# Patient Record
Sex: Female | Born: 1955 | Race: White | Hispanic: No | Marital: Married | Smoking: Never smoker
Health system: Southern US, Community
[De-identification: ages and names within clinical notes are randomized; demographics above are authoritative.]

## PROBLEM LIST (undated history)

## (undated) DIAGNOSIS — E785 Hyperlipidemia, unspecified: Secondary | ICD-10-CM

## (undated) DIAGNOSIS — M1990 Unspecified osteoarthritis, unspecified site: Secondary | ICD-10-CM

## (undated) DIAGNOSIS — T7840XA Allergy, unspecified, initial encounter: Secondary | ICD-10-CM

## (undated) DIAGNOSIS — Z923 Personal history of irradiation: Secondary | ICD-10-CM

## (undated) DIAGNOSIS — Z8571 Personal history of Hodgkin lymphoma: Secondary | ICD-10-CM

## (undated) DIAGNOSIS — Z853 Personal history of malignant neoplasm of breast: Secondary | ICD-10-CM

## (undated) DIAGNOSIS — K219 Gastro-esophageal reflux disease without esophagitis: Secondary | ICD-10-CM

## (undated) DIAGNOSIS — I10 Essential (primary) hypertension: Secondary | ICD-10-CM

## (undated) DIAGNOSIS — C801 Malignant (primary) neoplasm, unspecified: Secondary | ICD-10-CM

## (undated) DIAGNOSIS — C50919 Malignant neoplasm of unspecified site of unspecified female breast: Secondary | ICD-10-CM

## (undated) HISTORY — DX: Personal history of Hodgkin lymphoma: Z85.71

## (undated) HISTORY — DX: Personal history of malignant neoplasm of breast: Z85.3

## (undated) HISTORY — DX: Gastro-esophageal reflux disease without esophagitis: K21.9

## (undated) HISTORY — DX: Unspecified osteoarthritis, unspecified site: M19.90

## (undated) HISTORY — DX: Allergy, unspecified, initial encounter: T78.40XA

## (undated) HISTORY — DX: Hyperlipidemia, unspecified: E78.5

## (undated) HISTORY — DX: Malignant (primary) neoplasm, unspecified: C80.1

## (undated) HISTORY — PX: OTHER SURGICAL HISTORY: SHX169

## (undated) HISTORY — DX: Essential (primary) hypertension: I10

---
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
## 1977-01-05 HISTORY — PX: LYMPH NODE BIOPSY: SHX201

## 1998-01-05 DIAGNOSIS — I10 Essential (primary) hypertension: Secondary | ICD-10-CM

## 1998-01-05 HISTORY — DX: Essential (primary) hypertension: I10

## 1998-01-05 HISTORY — PX: ABDOMINAL HYSTERECTOMY: SHX81

## 2004-04-14 ENCOUNTER — Ambulatory Visit: Payer: Self-pay | Admitting: Unknown Physician Specialty

## 2004-07-18 ENCOUNTER — Ambulatory Visit: Payer: Self-pay | Admitting: Unknown Physician Specialty

## 2004-10-16 ENCOUNTER — Ambulatory Visit: Payer: Self-pay | Admitting: Unknown Physician Specialty

## 2004-10-27 ENCOUNTER — Ambulatory Visit: Payer: Self-pay | Admitting: Unknown Physician Specialty

## 2005-01-05 DIAGNOSIS — C50919 Malignant neoplasm of unspecified site of unspecified female breast: Secondary | ICD-10-CM

## 2005-01-05 HISTORY — PX: BREAST LUMPECTOMY: SHX2

## 2005-01-05 HISTORY — DX: Malignant neoplasm of unspecified site of unspecified female breast: C50.919

## 2005-01-05 HISTORY — PX: BREAST BIOPSY: SHX20

## 2005-02-27 ENCOUNTER — Emergency Department: Payer: Self-pay | Admitting: Emergency Medicine

## 2005-02-27 ENCOUNTER — Other Ambulatory Visit: Payer: Self-pay

## 2005-11-19 ENCOUNTER — Ambulatory Visit: Payer: Self-pay | Admitting: Unknown Physician Specialty

## 2005-11-25 ENCOUNTER — Ambulatory Visit: Payer: Self-pay | Admitting: Unknown Physician Specialty

## 2005-12-09 ENCOUNTER — Ambulatory Visit: Payer: Self-pay | Admitting: Oncology

## 2005-12-10 ENCOUNTER — Other Ambulatory Visit: Payer: Self-pay

## 2005-12-17 ENCOUNTER — Ambulatory Visit: Payer: Self-pay | Admitting: General Surgery

## 2006-01-05 DIAGNOSIS — C801 Malignant (primary) neoplasm, unspecified: Secondary | ICD-10-CM

## 2006-01-05 DIAGNOSIS — Z923 Personal history of irradiation: Secondary | ICD-10-CM

## 2006-01-05 HISTORY — PX: OTHER SURGICAL HISTORY: SHX169

## 2006-01-05 HISTORY — DX: Malignant (primary) neoplasm, unspecified: C80.1

## 2006-01-05 HISTORY — DX: Personal history of irradiation: Z92.3

## 2006-01-13 ENCOUNTER — Ambulatory Visit: Payer: Self-pay | Admitting: Radiation Oncology

## 2006-02-05 ENCOUNTER — Ambulatory Visit: Payer: Self-pay | Admitting: Radiation Oncology

## 2006-06-28 ENCOUNTER — Ambulatory Visit: Payer: Self-pay | Admitting: General Surgery

## 2006-12-27 ENCOUNTER — Ambulatory Visit: Payer: Self-pay | Admitting: General Surgery

## 2008-01-11 ENCOUNTER — Ambulatory Visit: Payer: Self-pay | Admitting: General Surgery

## 2008-02-10 ENCOUNTER — Ambulatory Visit: Payer: Self-pay | Admitting: General Surgery

## 2008-06-20 ENCOUNTER — Ambulatory Visit: Payer: Self-pay | Admitting: General Surgery

## 2009-01-23 ENCOUNTER — Ambulatory Visit: Payer: Self-pay | Admitting: General Surgery

## 2009-08-07 ENCOUNTER — Ambulatory Visit: Payer: Self-pay | Admitting: General Surgery

## 2009-11-12 ENCOUNTER — Ambulatory Visit: Payer: Self-pay | Admitting: Unknown Physician Specialty

## 2010-01-05 HISTORY — PX: UPPER GI ENDOSCOPY: SHX6162

## 2010-01-05 HISTORY — PX: COLONOSCOPY: SHX174

## 2010-03-06 ENCOUNTER — Ambulatory Visit: Payer: Self-pay | Admitting: General Surgery

## 2010-11-13 ENCOUNTER — Other Ambulatory Visit: Payer: Self-pay | Admitting: Internal Medicine

## 2010-11-14 ENCOUNTER — Ambulatory Visit: Payer: Self-pay | Admitting: Internal Medicine

## 2011-02-23 ENCOUNTER — Observation Stay: Payer: Self-pay | Admitting: Internal Medicine

## 2011-02-23 LAB — URINALYSIS, COMPLETE
Bilirubin,UR: NEGATIVE
Glucose,UR: 50 mg/dL (ref 0–75)
Leukocyte Esterase: NEGATIVE
Nitrite: NEGATIVE
Protein: NEGATIVE
RBC,UR: 2 /HPF (ref 0–5)
Specific Gravity: 1.042 (ref 1.003–1.030)
WBC UR: <1 /HPF (ref 0–5)

## 2011-02-23 LAB — COMPREHENSIVE METABOLIC PANEL
Albumin: 4.4 g/dL (ref 3.4–5.0)
Alkaline Phosphatase: 77 U/L (ref 50–136)
Anion Gap: 8 (ref 7–16)
Calcium, Total: 9.7 mg/dL (ref 8.5–10.1)
Co2: 27 mmol/L (ref 21–32)
EGFR (Non-African Amer.): >60
Glucose: 87 mg/dL (ref 65–99)
Osmolality: 270 (ref 275–301)
Potassium: 3.8 mmol/L (ref 3.5–5.1)
SGOT(AST): 26 U/L (ref 15–37)
SGPT (ALT): 31 U/L

## 2011-02-23 LAB — CBC
HGB: 15.6 g/dL (ref 12.0–16.0)
MCH: 31.6 pg (ref 26.0–34.0)
Platelet: 200 10*3/uL (ref 150–440)
RBC: 4.95 10*6/uL (ref 3.80–5.20)
RDW: 14.5 % (ref 11.5–14.5)

## 2011-02-23 LAB — CK TOTAL AND CKMB (NOT AT ARMC)
CK, Total: 81 U/L (ref 21–215)
CK-MB: 1.2 ng/mL (ref 0.5–3.6)

## 2011-02-23 LAB — TROPONIN I: Troponin-I: <0.02 ng/mL

## 2011-02-24 DIAGNOSIS — R079 Chest pain, unspecified: Secondary | ICD-10-CM

## 2011-02-24 LAB — BASIC METABOLIC PANEL
Anion Gap: 7 (ref 7–16)
BUN: 11 mg/dL (ref 7–18)
Calcium, Total: 9.4 mg/dL (ref 8.5–10.1)
Chloride: 102 mmol/L (ref 98–107)
Creatinine: 0.66 mg/dL (ref 0.60–1.30)
EGFR (Non-African Amer.): >60
Glucose: 92 mg/dL (ref 65–99)
Osmolality: 275 (ref 275–301)
Potassium: 3.9 mmol/L (ref 3.5–5.1)

## 2011-02-24 LAB — CBC WITH DIFFERENTIAL/PLATELET
Basophil #: 0.1 10*3/uL (ref 0.0–0.1)
Eosinophil #: 0.3 10*3/uL (ref 0.0–0.7)
HGB: 13.1 g/dL (ref 12.0–16.0)
Lymphocyte %: 31.2 %
MCH: 31.9 pg (ref 26.0–34.0)
MCHC: 33.3 g/dL (ref 32.0–36.0)
Neutrophil %: 54.5 %
Platelet: 235 10*3/uL (ref 150–440)
RDW: 13 % (ref 11.5–14.5)

## 2011-02-24 LAB — TSH: Thyroid Stimulating Horm: 4.28 u[IU]/mL

## 2011-02-24 LAB — LIPID PANEL
Cholesterol: 234 mg/dL — ABNORMAL HIGH (ref 0–200)
HDL Cholesterol: 84 mg/dL — ABNORMAL HIGH (ref 40–60)
Ldl Cholesterol, Calc: 129 mg/dL — ABNORMAL HIGH (ref 0–100)
Triglycerides: 105 mg/dL (ref 0–200)

## 2011-03-09 ENCOUNTER — Ambulatory Visit: Payer: Self-pay | Admitting: General Surgery

## 2011-03-17 ENCOUNTER — Encounter: Payer: Self-pay | Admitting: *Deleted

## 2011-03-18 ENCOUNTER — Ambulatory Visit (INDEPENDENT_AMBULATORY_CARE_PROVIDER_SITE_OTHER): Payer: PRIVATE HEALTH INSURANCE | Admitting: Cardiovascular Disease

## 2011-03-18 ENCOUNTER — Encounter: Payer: Self-pay | Admitting: Cardiovascular Disease

## 2011-03-18 VITALS — BP 153/93 | HR 88 | Ht 63.0 in | Wt 158.0 lb

## 2011-03-18 DIAGNOSIS — E785 Hyperlipidemia, unspecified: Secondary | ICD-10-CM

## 2011-03-18 DIAGNOSIS — E782 Mixed hyperlipidemia: Secondary | ICD-10-CM | POA: Insufficient documentation

## 2011-03-18 DIAGNOSIS — R079 Chest pain, unspecified: Secondary | ICD-10-CM | POA: Insufficient documentation

## 2011-03-18 DIAGNOSIS — R000 Tachycardia, unspecified: Secondary | ICD-10-CM

## 2011-03-18 DIAGNOSIS — I10 Essential (primary) hypertension: Secondary | ICD-10-CM | POA: Insufficient documentation

## 2011-03-18 MED ORDER — PRAVASTATIN SODIUM 20 MG PO TABS: 20.0000 mg | ORAL_TABLET | Freq: Every day | ORAL | Status: AC

## 2011-03-18 NOTE — Assessment & Plan Note (Signed)
Tachycardia in the hospital was likely secondary to allergic reaction. She had rash from a new soap. Heart rate is now improved. We have suggested she stay on her low-dose metoprolol.

## 2011-03-18 NOTE — Assessment & Plan Note (Signed)
No further episodes of chest pain. Negative stress test in the hospital. We have encouraged her to start her exercise program

## 2011-03-18 NOTE — Assessment & Plan Note (Signed)
Blood pressure is well controlled on today's visit. No changes made to the medications. 

## 2011-03-18 NOTE — Patient Instructions (Signed)
You are doing well. Please continue your aspirin 81 mg daily Start pravastatin 20 mg a day for cholesterol Have Dr. Arlana Pouch check cholesterol at the end of the summer Goal cholesterol is 150  Please call us if you have new issues that need to be addressed before your next appt.  Your physician wants you to follow-up in: 12 months.  You will receive a reminder letter in the mail two months in advance. If you don't receive a letter, please call our office to schedule the follow-up appointment.

## 2011-03-18 NOTE — Assessment & Plan Note (Signed)
Cholesterol is elevated. Given her history of radiation from Hodgkin's disease and strong family history of coronary artery disease we have suggested she start low-dose statin. Goal LDL is less than 70

## 2011-03-18 NOTE — Progress Notes (Signed)
Patient ID: Autumn Salazar, female    DOB: 1955/04/07, 56 y.o.   MRN: [id number]  HPI Comments: [name] is a very pleasant 56 year old woman with history of Hodgkin's disease, radiation at age 56 for 8 weeks, also history of right breast cancer, previous workup for mitral valve prolapse that was reportedly negative, who presented to [facility] with tachycardia, rash, chest discomfort. She had a stress test that showed no ischemia.  She presents today to establish care in the office. She has had no further episodes of chest pain. She believes that her symptoms were secondary to a rash from her body wash which caused a rash on her back and abdomen. This likely explain the tachycardia in the hospital.  She has continued on her blood pressure pills including metoprolol 2.5 mg twice a day  CT scan done in the hospital showed groundglass density likely secondary to radiation, seen in both lungs  Total cholesterol 234, LDL 129, HDL 84 EKG in the hospital showed sinus tachycardia with rate 117 beats per minute with no significant ST or T wave changes  She had a pharmacologic stress test that showed no significant ischemia. Very minimal thinning noted in the apical and distal intra-apical region consistent with attenuation artifact, ejection fraction 51%  She has a strong family history of coronary artery disease. Father died of MI at age 29, mother had heart failure in her 19s     Outpatient Encounter Prescriptions as of 03/18/2011  Medication Sig Dispense Refill  . metoprolol tartrate (LOPRESSOR) 25 MG tablet Take 12.5 mg by mouth 2 (two) times daily.       Marland Kitchen omeprazole (PRILOSEC) 20 MG capsule Take 20 mg by mouth daily.      Marland Kitchen eprosartan-hydrochlorothiazide (TEVETEN HCT) 600-12.5 MG per tablet Take 1 tablet by mouth daily.        Review of Systems  Constitutional: Negative.   HENT: Negative.   Eyes: Negative.   Respiratory: Negative.   Cardiovascular: Negative.   Gastrointestinal: Negative.     Musculoskeletal: Negative.   Skin: Negative.   Neurological: Negative.   Hematological: Negative.   Psychiatric/Behavioral: Negative.   All other systems reviewed and are negative.    BP 153/93  Pulse 88  Ht 5\' 3"  (1.6 m)  Wt 158 lb (71.668 kg)  BMI 27.99 kg/m2  Physical Exam  Nursing note and vitals reviewed. Constitutional: She is oriented to person, place, and time. She appears well-developed and well-nourished.  HENT:  Head: Normocephalic.  Nose: Nose normal.  Mouth/Throat: Oropharynx is clear and moist.  Eyes: Conjunctivae are normal. Pupils are equal, round, and reactive to light.  Neck: Normal range of motion. Neck supple. No JVD present.  Cardiovascular: Normal rate, regular rhythm, S1 normal, S2 normal, normal heart sounds and intact distal pulses.  Exam reveals no gallop and no friction rub.   No murmur heard. Pulmonary/Chest: Effort normal and breath sounds normal. No respiratory distress. She has no wheezes. She has no rales. She exhibits no tenderness.  Abdominal: Soft. Bowel sounds are normal. She exhibits no distension. There is no tenderness.  Musculoskeletal: Normal range of motion. She exhibits no edema and no tenderness.  Lymphadenopathy:    She has no cervical adenopathy.  Neurological: She is alert and oriented to person, place, and time. Coordination normal.  Skin: Skin is warm and dry. No rash noted. No erythema.  Psychiatric: She has a normal mood and affect. Her behavior is normal. Judgment and thought content normal.  Assessment and Plan

## 2011-12-15 ENCOUNTER — Ambulatory Visit: Payer: Self-pay | Admitting: General Surgery

## 2012-02-12 ENCOUNTER — Encounter: Payer: Self-pay | Admitting: *Deleted

## 2012-02-26 ENCOUNTER — Other Ambulatory Visit: Payer: Self-pay

## 2012-02-26 MED ORDER — METOPROLOL TARTRATE 25 MG PO TABS: 25.0000 mg | ORAL_TABLET | Freq: Every day | ORAL | Status: DC

## 2012-02-26 NOTE — Telephone Encounter (Signed)
LMOM for patient to call regarding dose of metoprolol.

## 2012-03-09 ENCOUNTER — Ambulatory Visit: Payer: Self-pay | Admitting: General Surgery

## 2012-03-29 ENCOUNTER — Ambulatory Visit (INDEPENDENT_AMBULATORY_CARE_PROVIDER_SITE_OTHER): Payer: PRIVATE HEALTH INSURANCE | Admitting: Cardiovascular Disease

## 2012-03-29 ENCOUNTER — Encounter: Payer: Self-pay | Admitting: Cardiovascular Disease

## 2012-03-29 VITALS — BP 150/80 | HR 102 | Ht 63.0 in | Wt 161.8 lb

## 2012-03-29 DIAGNOSIS — R000 Tachycardia, unspecified: Secondary | ICD-10-CM

## 2012-03-29 DIAGNOSIS — R002 Palpitations: Secondary | ICD-10-CM

## 2012-03-29 DIAGNOSIS — E785 Hyperlipidemia, unspecified: Secondary | ICD-10-CM

## 2012-03-29 DIAGNOSIS — R0602 Shortness of breath: Secondary | ICD-10-CM

## 2012-03-29 DIAGNOSIS — I10 Essential (primary) hypertension: Secondary | ICD-10-CM

## 2012-03-29 MED ORDER — PROPRANOLOL HCL 20 MG PO TABS: 20.0000 mg | ORAL_TABLET | Freq: Three times a day (TID) | ORAL | Status: DC | PRN

## 2012-03-29 MED ORDER — METOPROLOL TARTRATE 25 MG PO TABS: 25.0000 mg | ORAL_TABLET | Freq: Two times a day (BID) | ORAL | Status: DC

## 2012-03-29 NOTE — Progress Notes (Signed)
Patient ID: Autumn Salazar, female    DOB: December 12, 1955, 57 y.o.   MRN: [id number]  HPI Comments: Autumn Salazar is a very pleasant 57 year old woman with history of Hodgkin's disease, radiation at age 5 for 8 weeks, also history of right breast cancer, previous workup for mitral valve prolapse that was reportedly negative, who presented to [facility] with tachycardia, rash, chest discomfort. She had a stress test that showed no ischemia. she has chronic baseline tachycardia.  She reports that she has stopped her metoprolol. She woke up recently in the evening with reports of shortness of breath. She read the label on the medication which want her against taking beta blockers in the setting of heart failure. She then stopped the beta blocker. She is continued on her ARB/HCTZ blood pressure medication. She is concerned about her elevated heart rate. She has been measuring her blood pressure and heart rate at home. Blood pressure is typically well controlled but heart rate is typically in the 90 to more than 100 range.  Previous CT scan done in the hospital showed groundglass density likely secondary to radiation, seen in both lungs  Total cholesterol 234, LDL 129, HDL 84 Previous EKG in the hospital showed sinus tachycardia with rate 117 beats per minute with no significant ST or T wave changes  She had a pharmacologic stress test that showed no significant ischemia. Very minimal thinning noted in the apical and distal intra-apical region consistent with attenuation artifact, ejection fraction 51%  She has a strong family history of coronary artery disease. Father died of MI at age 57, mother had heart failure in her 32s  EKG today shows sinus tachycardia with rate 102 beats per minute with no significant ST or T wave changes     Outpatient Encounter Prescriptions as of 03/29/2012  Medication Sig Dispense Refill  . aspirin 81 MG tablet Take 81 mg by mouth daily.      Marland Kitchen eprosartan-hydrochlorothiazide (TEVETEN  HCT) 600-12.5 MG per tablet Take 1 tablet by mouth daily.      Marland Kitchen omeprazole (PRILOSEC) 20 MG capsule Take 20 mg by mouth daily.      . metoprolol tartrate (LOPRESSOR) 25 MG tablet Currently not taking  180 tablet  3    Review of Systems  Constitutional: Negative.   HENT: Negative.   Eyes: Negative.   Respiratory: Negative.   Cardiovascular: Negative.   Gastrointestinal: Negative.   Musculoskeletal: Negative.   Skin: Negative.   Neurological: Negative.   Psychiatric/Behavioral: Negative.   All other systems reviewed and are negative.    BP 150/80  Pulse 102  Ht 5\' 3"  (1.6 m)  Wt 161 lb 12 oz (73.369 kg)  BMI 28.66 kg/m2  Physical Exam  Nursing note and vitals reviewed. Constitutional: She is oriented to person, place, and time. She appears well-developed and well-nourished.  HENT:  Head: Normocephalic.  Nose: Nose normal.  Mouth/Throat: Oropharynx is clear and moist.  Eyes: Conjunctivae are normal. Pupils are equal, round, and reactive to light.  Neck: Normal range of motion. Neck supple. No JVD present.  Cardiovascular: Normal rate, regular rhythm, S1 normal, S2 normal, normal heart sounds and intact distal pulses.  Exam reveals no gallop and no friction rub.   No murmur heard. Pulmonary/Chest: Effort normal and breath sounds normal. No respiratory distress. She has no wheezes. She has no rales. She exhibits no tenderness.  Abdominal: Soft. Bowel sounds are normal. She exhibits no distension. There is no tenderness.  Musculoskeletal: Normal range of motion.  She exhibits no edema and no tenderness.  Lymphadenopathy:    She has no cervical adenopathy.  Neurological: She is alert and oriented to person, place, and time. Coordination normal.  Skin: Skin is warm and dry. No rash noted. No erythema.  Psychiatric: She has a normal mood and affect. Her behavior is normal. Judgment and thought content normal.    Assessment and Plan

## 2012-03-29 NOTE — Assessment & Plan Note (Signed)
We have suggested she hold her ARB/HCTZ combo pill at this time as she reports it is very expensive for her. On metoprolol and propranolol if her blood pressure continues to run high, we will start her on a low-dose generic ARB.

## 2012-03-29 NOTE — Patient Instructions (Addendum)
Please restart metoprolol 25 mg twice a day  Take propranolol as needed up to three times a day for fast heart rate  If you have fatigue on metoprolol, call our office  Please call us if you have new issues that need to be addressed before your next appt.  Your physician wants you to follow-up in: 6 months.  You will receive a reminder letter in the mail two months in advance. If you don't receive a letter, please call our office to schedule the follow-up appointment.

## 2012-03-29 NOTE — Assessment & Plan Note (Signed)
She would like to work on her weight and exercise and recheck her cholesterol at the end of the summer. She would consider starting red yeast rice. She does not want a prescription at this time.

## 2012-03-29 NOTE — Assessment & Plan Note (Signed)
We have suggested that she restart her metoprolol tartrate 25 mg twice a day. If she does have periods of worsening tachycardia, she could take propranolol when necessary. She has a wrist monitor to track her heart rate which will help her manage her tachycardia symptoms. If she has fatigue on metoprolol, we could try an alternate beta blocker, possibly even bystolic, with propranolol for breakthrough palpitations.

## 2012-12-06 ENCOUNTER — Encounter: Payer: Self-pay | Admitting: *Deleted

## 2012-12-20 ENCOUNTER — Encounter: Payer: Self-pay | Admitting: General Surgery

## 2012-12-20 ENCOUNTER — Ambulatory Visit (INDEPENDENT_AMBULATORY_CARE_PROVIDER_SITE_OTHER): Payer: PRIVATE HEALTH INSURANCE | Admitting: General Surgery

## 2012-12-20 VITALS — BP 130/80 | HR 76 | Resp 12 | Ht 63.0 in | Wt 163.0 lb

## 2012-12-20 DIAGNOSIS — D179 Benign lipomatous neoplasm, unspecified: Secondary | ICD-10-CM

## 2012-12-20 NOTE — Patient Instructions (Signed)
Patient to return for excision of left thigh lipoma in our office.

## 2012-12-20 NOTE — Progress Notes (Signed)
Patient ID: Autumn Salazar, female   DOB: 1955/04/28, 57 y.o.   MRN: [id number]  Chief Complaint  Patient presents with  . Cyst    evaluation of cyst on left leg that is getting larger    HPI Autumn Salazar is a 57 y.o. female who presents for an evaluation of a left leg cyst that has gotten larger. The patient states she first noticed it approximately 1.5 years ago. It has gradually gotten larger over time. She states that is has started to cause her some discomfort when she walks as well as when something presses up against it.    HPI  Past Medical History  Diagnosis Date  . Esophageal reflux   . History of hodgkin's lymphoma   . History of breast cancer 2007?  Marland Kitchen HLD (hyperlipidemia)   . HTN (hypertension) 2000  . Cancer 2008    right lumpectomy with sentinel node biopsy and radiation therapy    Past Surgical History  Procedure Laterality Date  . Other surgical history    . Biospy      of neck  . Breast lumpectomy  2007    right lumpectomy with sentinel node biopsy and radiation therapy  . Mammosite balloon placement   2008  . Lymph node biopsy  1979  . Abdominal hysterectomy  2000  . Colonoscopy  2012    Dr. Evette Cristal  . Upper gi endoscopy  2012    Dr. Mechele Collin    Family History  Problem Relation Age of Onset  . Heart disease      strong family history  . Diabetes      family history  . Heart attack Father 9    MI    Social History History  Substance Use Topics  . Smoking status: Never Smoker   . Smokeless tobacco: Never Used  . Alcohol Use: Yes     Comment: wine socially    No Known Allergies  Current Outpatient Prescriptions  Medication Sig Dispense Refill  . aspirin 81 MG tablet Take 81 mg by mouth daily.      . metoprolol tartrate (LOPRESSOR) 25 MG tablet Take 25 mg by mouth daily.      Marland Kitchen omeprazole (PRILOSEC) 20 MG capsule Take 20 mg by mouth daily.       No current facility-administered medications for this visit.    Review of Systems Review of  Systems  Constitutional: Negative.   Respiratory: Negative.   Cardiovascular: Negative.     Blood pressure 130/80, pulse 76, resp. rate 12, height 5\' 3"  (1.6 m), weight 163 lb (73.936 kg).  Physical Exam Physical Exam  Constitutional: She appears well-developed and well-nourished.  Skin:        Data Reviewed None  Assessment    Symptomatic lipoma left thigh.    Plan    Recommend excision in office. Discussed fully and agreeable.        Janica Eldred G 12/20/2012, 1:10 PM

## 2013-01-09 ENCOUNTER — Ambulatory Visit (INDEPENDENT_AMBULATORY_CARE_PROVIDER_SITE_OTHER): Payer: PRIVATE HEALTH INSURANCE | Admitting: General Surgery

## 2013-01-09 ENCOUNTER — Encounter: Payer: Self-pay | Admitting: General Surgery

## 2013-01-09 VITALS — BP 140/76 | HR 76 | Resp 12 | Ht 63.0 in | Wt 164.0 lb

## 2013-01-09 DIAGNOSIS — D2120 Benign neoplasm of connective and other soft tissue of unspecified lower limb, including hip: Secondary | ICD-10-CM

## 2013-01-09 DIAGNOSIS — D179 Benign lipomatous neoplasm, unspecified: Secondary | ICD-10-CM

## 2013-01-09 NOTE — Progress Notes (Signed)
Patient ID: Autumn Salazar, female   DOB: 16-Aug-1955, 58 y.o.   MRN: [id number] Here for an excision of lipoma on left outer thigh.  Procedure note: The palpable mass in the lateral left thigh was marked and 10 mL of 0.5% Marcaine mixed with 1% Xylocaine instilled. Area was prepped with ChloraPrep and draped. 2 cm vertical incision was made overlying the mass. A well-circumscribed lipoma about 2 cm in size was identified and removed without difficulty. Subcutaneous tissue was closed with 3-0 Vicryl and the skin with subcuticular 4-0 Vicryl. Steri-Strips applied and dressed with Telfa and Tegaderm. No immediate problems from the procedure noted. Wound care instructions given.

## 2013-01-09 NOTE — Patient Instructions (Addendum)
Keep area clean and dry. Steri strips will fall off on their own. We will call with your pathology report.

## 2013-01-11 ENCOUNTER — Encounter: Payer: Self-pay | Admitting: General Surgery

## 2013-01-12 LAB — PATHOLOGY

## 2013-03-13 ENCOUNTER — Ambulatory Visit: Payer: Self-pay | Admitting: General Surgery

## 2013-03-14 ENCOUNTER — Encounter: Payer: Self-pay | Admitting: General Surgery

## 2013-03-20 ENCOUNTER — Encounter: Payer: Self-pay | Admitting: General Surgery

## 2013-03-20 ENCOUNTER — Ambulatory Visit (INDEPENDENT_AMBULATORY_CARE_PROVIDER_SITE_OTHER): Payer: PRIVATE HEALTH INSURANCE | Admitting: General Surgery

## 2013-03-20 VITALS — BP 130/68 | HR 76 | Resp 12 | Ht 63.0 in | Wt 166.0 lb

## 2013-03-20 DIAGNOSIS — Z853 Personal history of malignant neoplasm of breast: Secondary | ICD-10-CM

## 2013-03-20 NOTE — Progress Notes (Signed)
Patient ID: Autumn Salazar, female   DOB: 14-Feb-1955, 58 y.o.   MRN: [id number]  Chief Complaint  Patient presents with  . Follow-up    mammogram    HPI Autumn Salazar is a 58 y.o. female who presents for a breast evaluation. The most recent mammogram was done on 03/13/13. Patient does perform regular self breast checks and gets regular mammograms done.    HPI  Past Medical History  Diagnosis Date  . Esophageal reflux   . History of hodgkin's lymphoma   . History of breast cancer 2007?  Marland Kitchen HLD (hyperlipidemia)   . HTN (hypertension) 2000  . Cancer 2008    right lumpectomy with sentinel node biopsy and radiation therapy    Past Surgical History  Procedure Laterality Date  . Other surgical history    . Biospy      of neck  . Breast lumpectomy  2007    right lumpectomy with sentinel node biopsy and radiation therapy  . Mammosite balloon placement   2008  . Lymph node biopsy  1979  . Abdominal hysterectomy  2000  . Colonoscopy  2012    Dr. Jamal Collin  . Upper gi endoscopy  2012    Dr. Vira Agar    Family History  Problem Relation Age of Onset  . Heart disease      strong family history  . Diabetes      family history  . Heart attack Father 25    MI    Social History History  Substance Use Topics  . Smoking status: Never Smoker   . Smokeless tobacco: Never Used  . Alcohol Use: Yes     Comment: wine socially    No Known Allergies  Current Outpatient Prescriptions  Medication Sig Dispense Refill  . aspirin 81 MG tablet Take 81 mg by mouth daily.      . metoprolol tartrate (LOPRESSOR) 25 MG tablet Take 25 mg by mouth daily.      Marland Kitchen omeprazole (PRILOSEC) 20 MG capsule Take 20 mg by mouth daily.       No current facility-administered medications for this visit.    Review of Systems Review of Systems  Constitutional: Negative.   Respiratory: Negative.   Cardiovascular: Negative.     Blood pressure 130/68, pulse 76, resp. rate 12, height 5\' 3"  (1.6 m), weight 166 lb  (75.297 kg).  Physical Exam Physical Exam  Constitutional: She is oriented to person, place, and time. She appears well-developed and well-nourished.  Eyes: Conjunctivae are normal. No scleral icterus.  Neck: Neck supple. No mass and no thyromegaly present.  Cardiovascular: Normal rate, regular rhythm, normal heart sounds, intact distal pulses and normal pulses.   Pulses:      Dorsalis pedis pulses are 2+ on the right side, and 2+ on the left side.       Posterior tibial pulses are 2+ on the right side, and 2+ on the left side.  No edema. No vv. No stasis changes.   Pulmonary/Chest: Breath sounds normal. Right breast exhibits no inverted nipple, no mass, no nipple discharge, no skin change and no tenderness. Left breast exhibits no inverted nipple, no mass, no nipple discharge, no skin change and no tenderness.  Right breast lumpectomy site below areola is firm in feel but is stable over the years.  Abdominal: Soft. Bowel sounds are normal. There is no hepatosplenomegaly. There is no tenderness. No hernia.  Lymphadenopathy:    She has no cervical adenopathy.    She has  no axillary adenopathy.  Neurological: She is alert and oriented to person, place, and time. She has normal strength. No sensory deficit.  Skin: Skin is warm and dry.    Data Reviewed Mammogram reviewed stable   Assessment    Stable exam . Patient is now post 8 year right breast cancer treatment.     Plan   Patient to return in one year bilateral screening  mammogram.        SANKAR,SEEPLAPUTHUR G 03/21/2013, 6:49 AM

## 2013-03-20 NOTE — Patient Instructions (Addendum)
Patient to return in one year bilateral screening mammogram. Continue monthly self breast exam, call for any problems.

## 2013-03-21 ENCOUNTER — Encounter: Payer: Self-pay | Admitting: General Surgery

## 2013-03-21 DIAGNOSIS — Z853 Personal history of malignant neoplasm of breast: Secondary | ICD-10-CM | POA: Insufficient documentation

## 2013-03-22 IMAGING — CR DG CHEST 1V PORT
1 series · 1 of 1 positions shown · non-contrast
Comparison: none

REASON FOR EXAM: Chest Pain
COMMENTS:

PROCEDURE:     DXR - DXR PORTABLE CHEST SINGLE VIEW  - February 23, 2011  [DATE]
RESULT:     The lungs are clear. The cardiac silhouette and visualized bony
skeleton are unremarkable.

[portable]
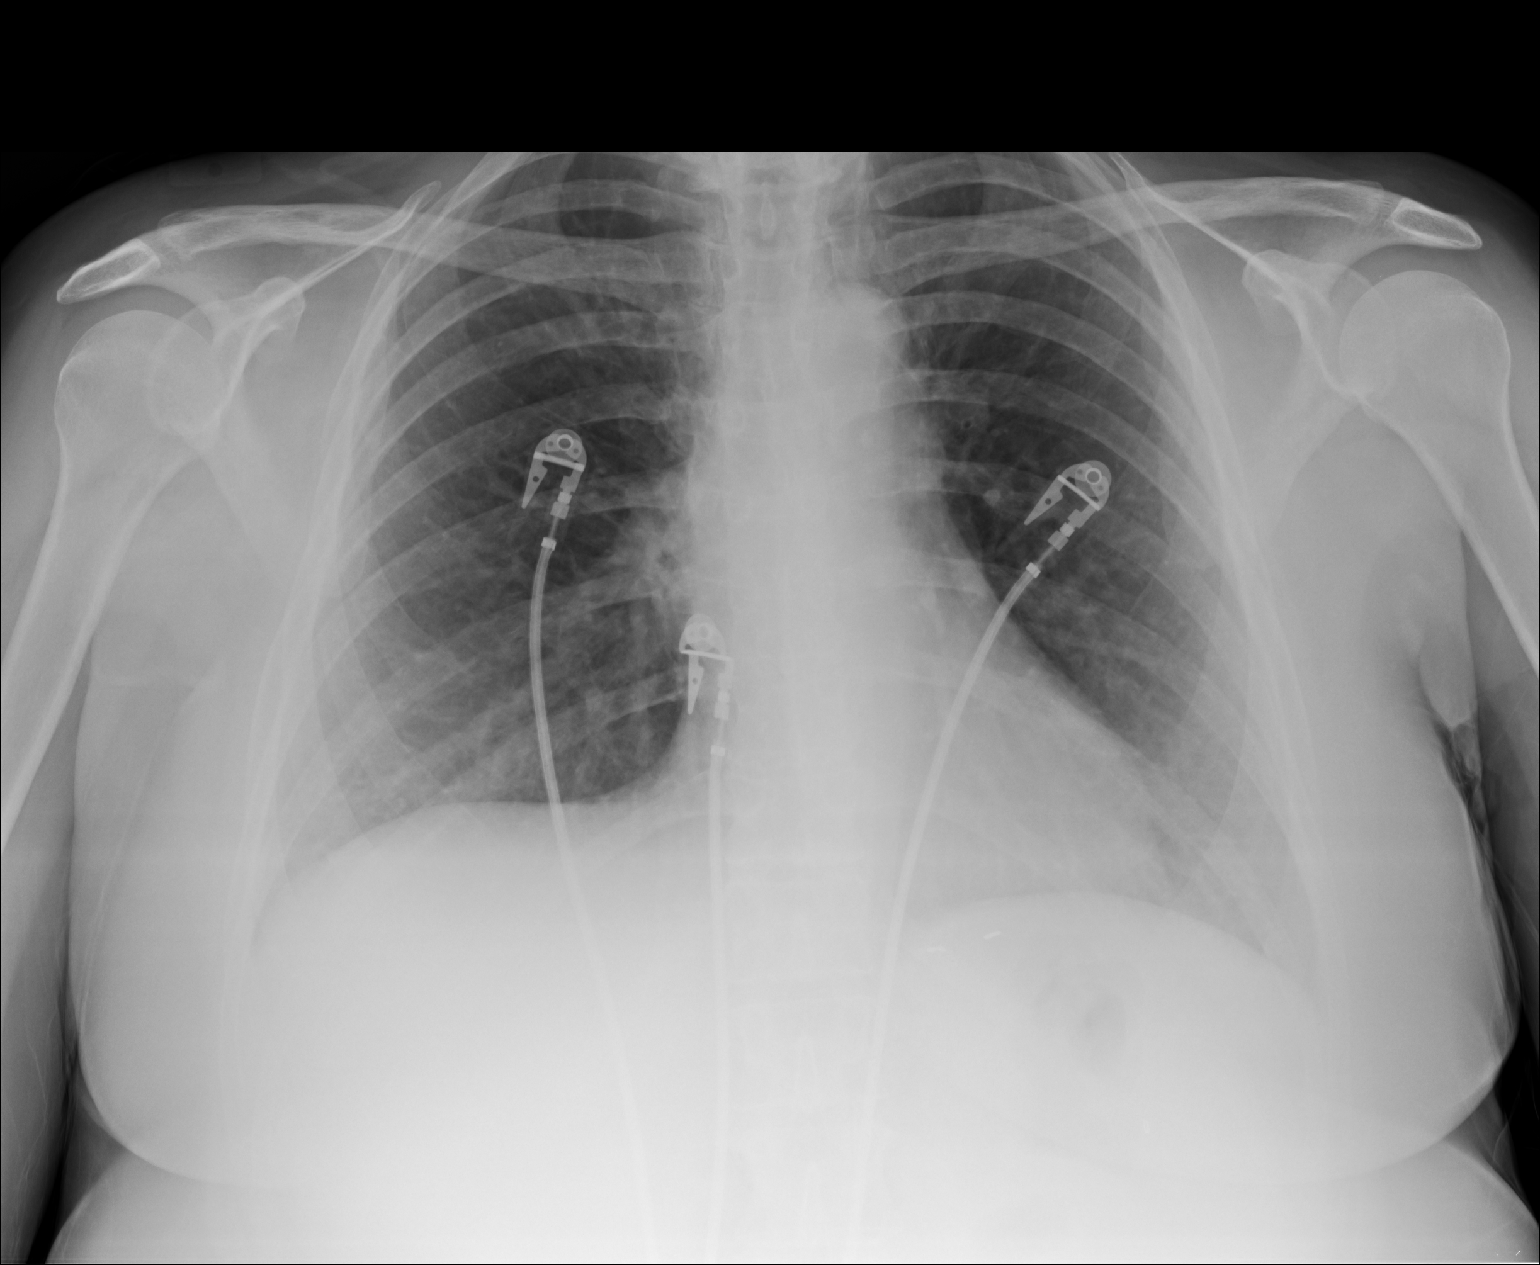

[1 of 1 positions shown; findings below may reference images not displayed]

IMPRESSION: 1. Chest radiograph without evidence of acute cardiopulmonary disease.

## 2013-03-23 ENCOUNTER — Encounter: Payer: Self-pay | Admitting: General Surgery

## 2013-06-26 ENCOUNTER — Ambulatory Visit (INDEPENDENT_AMBULATORY_CARE_PROVIDER_SITE_OTHER): Payer: PRIVATE HEALTH INSURANCE | Admitting: Podiatry

## 2013-06-26 ENCOUNTER — Ambulatory Visit (INDEPENDENT_AMBULATORY_CARE_PROVIDER_SITE_OTHER): Payer: PRIVATE HEALTH INSURANCE

## 2013-06-26 ENCOUNTER — Encounter: Payer: Self-pay | Admitting: Podiatry

## 2013-06-26 VITALS — BP 154/88 | HR 96 | Resp 16 | Ht 63.0 in | Wt 165.0 lb

## 2013-06-26 DIAGNOSIS — M21619 Bunion of unspecified foot: Secondary | ICD-10-CM

## 2013-06-26 DIAGNOSIS — M19079 Primary osteoarthritis, unspecified ankle and foot: Secondary | ICD-10-CM

## 2013-06-26 DIAGNOSIS — M779 Enthesopathy, unspecified: Secondary | ICD-10-CM

## 2013-06-26 DIAGNOSIS — M7750 Other enthesopathy of unspecified foot: Secondary | ICD-10-CM

## 2013-06-26 DIAGNOSIS — M778 Other enthesopathies, not elsewhere classified: Secondary | ICD-10-CM

## 2013-06-26 DIAGNOSIS — M21612 Bunion of left foot: Secondary | ICD-10-CM

## 2013-06-26 DIAGNOSIS — M19072 Primary osteoarthritis, left ankle and foot: Secondary | ICD-10-CM

## 2013-06-26 MED ORDER — MELOXICAM 15 MG PO TABS: 15.0000 mg | ORAL_TABLET | Freq: Every day | ORAL | Status: DC

## 2013-06-26 NOTE — Progress Notes (Signed)
   Subjective:    Patient ID: Autumn Salazar, female    DOB: 06-06-1955, 58 y.o.   MRN: [id number]  HPI Comments: i have pain in my left foot. i have a bunion and the top of my foot hurts all the time. Its been going on for one year and its getting worse. Walking seems to bother my foot. i wrap it in hot towel, icy hot, put a hot towel in between my toes.   Foot Pain      Review of Systems     Objective:   Physical Exam: I have reviewed her past medical history medications allergies surgeries social history and review of systems. 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 Pulses are strongly palpable bilateral. Neurologic sensorium is intact per since once the monofilament. Deep tendon reflexes are brisk and equal bilateral. Muscle strength is 4/5 dorsiflexors plantar flexors evertors and inverters and all intrinsic musculature appear to be normal. Orthopedic evaluation demonstrates all joints distal to the ankle a full range of motion without crepitation with exception of the first and second metatarsophalangeal joint of the left foot. 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 With limitation on dorsiflexion. She has pain and overlying edema second metatarsophalangeal joint second digit left. 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 It also demonstrates osteoarthritis and dorsal spurring of the first metatarsophalangeal joint. Mild metatarsus adductus is noted. Cutaneous evaluation demonstrates all joints distal to the ankle appear to be normal. With the exception of those mentioned previously.        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  Plan: Discussed etiology pathology conservative versus surgical therapies. She will followup with me in September  for a consult for November surgery. We did discuss an arthroplasty to the to the MTPJ of the second metatarsophalangeal joint with tendon interposition. Also we discussed a bunion repair with screw fixation left. I will followup with her for consult in the near future. In the meantime she will continue medicating milligrams 1 by mouth daily.

## 2013-09-18 ENCOUNTER — Other Ambulatory Visit: Payer: Self-pay | Admitting: *Deleted

## 2013-09-18 ENCOUNTER — Telehealth: Payer: Self-pay

## 2013-09-18 MED ORDER — METOPROLOL TARTRATE 25 MG PO TABS: 25.0000 mg | ORAL_TABLET | Freq: Every day | ORAL | Status: DC

## 2013-09-18 NOTE — Telephone Encounter (Signed)
Requested Prescriptions   Signed Prescriptions Disp Refills  . metoprolol tartrate (LOPRESSOR) 25 MG tablet 30 tablet 0    Sig: Take 1 tablet (25 mg total) by mouth daily.    Authorizing Provider: Minna Merritts    Ordering User: Britt Bottom

## 2013-09-18 NOTE — Telephone Encounter (Signed)
Pharmacist has question regarding E Script on Metoprolol Tartrate please call.

## 2013-09-18 NOTE — Telephone Encounter (Signed)
Spoke with Roselyn Reef at [facility] the metoprolol tart 25 mg one tablet twice a day #90 with 3 refills.

## 2013-09-18 NOTE — Telephone Encounter (Signed)
LMTCB concerning Rx metoprolol concern.

## 2013-09-25 ENCOUNTER — Ambulatory Visit: Payer: PRIVATE HEALTH INSURANCE | Admitting: Podiatry

## 2013-10-18 ENCOUNTER — Ambulatory Visit: Payer: PRIVATE HEALTH INSURANCE | Admitting: Podiatry

## 2013-11-06 ENCOUNTER — Encounter: Payer: Self-pay | Admitting: Podiatry

## 2014-03-13 ENCOUNTER — Ambulatory Visit: Payer: Self-pay | Admitting: Internal Medicine

## 2014-03-15 ENCOUNTER — Ambulatory Visit: Payer: Self-pay | Admitting: General Surgery

## 2014-03-16 ENCOUNTER — Encounter: Payer: Self-pay | Admitting: General Surgery

## 2014-03-22 ENCOUNTER — Encounter: Payer: Self-pay | Admitting: General Surgery

## 2014-03-22 ENCOUNTER — Ambulatory Visit (INDEPENDENT_AMBULATORY_CARE_PROVIDER_SITE_OTHER): Payer: PRIVATE HEALTH INSURANCE | Admitting: General Surgery

## 2014-03-22 VITALS — BP 130/72 | HR 78 | Resp 12 | Ht 63.0 in | Wt 170.0 lb

## 2014-03-22 DIAGNOSIS — Z853 Personal history of malignant neoplasm of breast: Secondary | ICD-10-CM | POA: Diagnosis not present

## 2014-03-22 DIAGNOSIS — E042 Nontoxic multinodular goiter: Secondary | ICD-10-CM

## 2014-03-22 NOTE — Progress Notes (Signed)
Patient ID: Autumn Salazar, female   DOB: April 04, 1955, 59 y.o.   MRN: [id number]  Chief Complaint  Patient presents with  . Follow-up    mammogram and thyroid CT    HPI Autumn Salazar is a 59 y.o. female.  who presents for a breast evaluation and thyroid CT scan The most recent mammogram was done on 03-15-14.  Patient does perform regular self breast checks and gets regular mammograms done.  No new breast complaints. 2 months ago patient started noticing the sensation in her throat every time she swallows. She subsequently also noticed slight weight gain and some shortness of breath with activity. Ultrasound of the neck was performed.   HPI  Past Medical History  Diagnosis Date  . Esophageal reflux   . History of hodgkin's lymphoma   . History of breast cancer 2007?  Marland Kitchen HLD (hyperlipidemia)   . HTN (hypertension) 2000  . Cancer 2008    right lumpectomy with sentinel node biopsy and radiation therapy    Past Surgical History  Procedure Laterality Date  . Other surgical history    . Biospy      of neck  . Breast lumpectomy  2007    right lumpectomy with sentinel node biopsy and radiation therapy  . Mammosite balloon placement   2008  . Lymph node biopsy  1979  . Abdominal hysterectomy  2000  . Colonoscopy  2012    Dr. Jamal Collin  . Upper gi endoscopy  2012    Dr. Vira Agar    Family History  Problem Relation Age of Onset  . Heart disease      strong family history  . Diabetes      family history  . Heart attack Father 103    MI    Social History History  Substance Use Topics  . Smoking status: Never Smoker   . Smokeless tobacco: Never Used  . Alcohol Use: Yes     Comment: wine socially    No Known Allergies  Current Outpatient Prescriptions  Medication Sig Dispense Refill  . aspirin 81 MG tablet Take 81 mg by mouth daily.    . meloxicam (MOBIC) 15 MG tablet Take 1 tablet (15 mg total) by mouth daily. 30 tablet 3  . metoprolol tartrate (LOPRESSOR) 25 MG tablet Take  1 tablet (25 mg total) by mouth daily. 30 tablet 0  . omeprazole (PRILOSEC) 20 MG capsule Take 20 mg by mouth daily.     No current facility-administered medications for this visit.    Review of Systems Review of Systems  Constitutional: Negative.   Respiratory: Positive for shortness of breath. Negative for apnea, cough, choking, chest tightness, wheezing and stridor.   Cardiovascular: Negative.     Blood pressure 130/72, pulse 78, resp. rate 12, height 5\' 3"  (1.6 m), weight 170 lb (77.111 kg).  Physical Exam Physical Exam  Constitutional: She is oriented to person, place, and time. She appears well-developed and well-nourished.  Eyes: Conjunctivae are normal. No scleral icterus.  Neck: Neck supple. Thyromegaly (Firmness of both lobes without a defined mass on clinical exam) present.  Cardiovascular: Normal rate, regular rhythm and normal heart sounds.   Pulmonary/Chest: Effort normal and breath sounds normal. Right breast exhibits no inverted nipple, no mass, no nipple discharge, no skin change and no tenderness. Left breast exhibits no inverted nipple, no mass, no nipple discharge, no skin change and no tenderness.  Palpable lump in the lumpectomy site of the right breast. Unchanged from before.  Lymphadenopathy:    She has no cervical adenopathy.    She has no axillary adenopathy.  Neurological: She is alert and oriented to person, place, and time.  Skin: Skin is warm and dry.    Data Reviewed Mammogram reviewed and stable. Ultrasound of the thyroid shows multiple nodules with two dominant nodules on the right and one on the left.  Assessment    Multinodular goiter seen on ultrasound. History of right breast cancer. Prior radiation of the neck for Hodgkin's lymphoma.    Plan    Lab work-TSH and free T4. Bilateral core biopsy of thyroid nodules.        Mandy Fitzwater G 03/26/2014, 3:30 PM

## 2014-03-22 NOTE — Patient Instructions (Addendum)
The patient is aware to call back for any questions or concerns. Continue self breast exams. Call office for any new breast issues or concerns. Follow up appointment to be announced.

## 2014-03-26 ENCOUNTER — Encounter: Payer: Self-pay | Admitting: General Surgery

## 2014-04-03 ENCOUNTER — Other Ambulatory Visit: Payer: PRIVATE HEALTH INSURANCE

## 2014-04-03 ENCOUNTER — Ambulatory Visit (INDEPENDENT_AMBULATORY_CARE_PROVIDER_SITE_OTHER): Payer: PRIVATE HEALTH INSURANCE | Admitting: General Surgery

## 2014-04-03 ENCOUNTER — Encounter: Payer: Self-pay | Admitting: General Surgery

## 2014-04-03 VITALS — BP 190/100 | HR 100 | Resp 18 | Ht 63.0 in | Wt 171.0 lb

## 2014-04-03 DIAGNOSIS — E041 Nontoxic single thyroid nodule: Secondary | ICD-10-CM

## 2014-04-03 NOTE — Progress Notes (Signed)
Here today for thyroid biopsy. US showed multiple nodules. The ;larger one on right was core biopsied. The left one did not provide adequate tissue. FNA was done in addition on left. Likely multinodular goiter. Await path report.Marland Kitchen

## 2014-04-03 NOTE — Patient Instructions (Signed)
May use ice pack off and on today Keep area clean

## 2014-04-04 ENCOUNTER — Encounter: Payer: Self-pay | Admitting: General Surgery

## 2014-04-05 ENCOUNTER — Ambulatory Visit: Payer: PRIVATE HEALTH INSURANCE | Admitting: General Surgery

## 2014-04-29 NOTE — H&P (Signed)
PATIENT NAME:  Autumn Salazar, Autumn Salazar MR#:  [id number] DATE OF BIRTH:  11-21-1955  DATE OF ADMISSION:  02/23/2011  PRIMARY CARE PHYSICIAN: Benita Stabile, MD   CHIEF COMPLAINT: Chest pain.   HISTORY OF PRESENT ILLNESS: This is a 59 year old female who presents with chest pain in the center of her chest different degrees lasting 45 minutes to an hour then some lingering pain afterwards, currently chest pain free, some radiation to the jaw with some numbness. No shortness of breath. No nausea. No diaphoresis. She was sitting at her desk at the time. She did have a stressful morning at work. No relieving factors. Also, she has had some pain in her neck and had been seeing a chiropractor and also having some right upper chest pain and she has a mammogram and follow-up with the breast surgeon coming up with her history of breast cancer.   PAST MEDICAL HISTORY:  1. Hypertension.  2. Breast cancer four years ago status post MammoSite injection and radiation treatment.  3. History of Hodgkin's lymphoma.  4. Gastroesophageal reflux disease.   PAST SURGICAL HISTORY:  1. Exploratory lap. 2. Splenectomy. 3. Hysterectomy.   MEDICATIONS:  1. Prilosec 20 mg b.i.d.  2. She had stopped taking her Teveten/HCT 600/12.5 mg daily but restarted today with blood pressure being high.   ALLERGIES: No known drug allergies.   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 No smoking. Occasional wine every other day, two glasses. No drug use.   FAMILY HISTORY: Father died at 52 of an MI. Mother died at 27 of heart failure. Sister with hypertension, asthma, COPD. Brother with heart disease and hypertension.  REVIEW OF SYSTEMS: CONSTITUTIONAL: No fever, chills, or sweats. No weight loss. No weight gain. No weakness or fatigue. EYES: She does wear reading glasses. EARS, NOSE, MOUTH, AND THROAT: No hearing loss. No sore throat. No difficulty swallowing. CARDIOVASCULAR: Positive for chest pressure. No palpitations.  RESPIRATORY: No shortness of breath, no wheezing, no cough, no sputum, no hemoptysis. GASTROINTESTINAL: No nausea, no vomiting, no abdominal pain, no diarrhea, no constipation, no bright red blood per rectum, no melena. GENITOURINARY: No burning on urination. No hematuria. MUSCULOSKELETAL: Positive for neck pain. INTEGUMENTARY: No rashes or eruptions. NEUROLOGIC: No fainting or blackouts. PSYCHIATRIC: No anxiety or depression. ENDOCRINE: No thyroid problems. HEMATOLOGIC/LYMPHATIC: No anemia. No easy bruising or bleeding.   PHYSICAL EXAMINATION:   VITAL SIGNS: Temperature 98.2, pulse 110, respirations 18, blood pressure 182/101, pulse oximetry 96%.   GENERAL: No respiratory distress.   EYES: Conjunctivae and lids normal. Pupils equal, round, and reactive to light. Extraocular muscles intact. No nystagmus.   EARS, NOSE, MOUTH, AND THROAT: Tympanic membrane no erythema. Nasal mucosa no erythema. Throat no erythema. No exudate seen. Lips and gums normal.   NECK: No JVD. No bruits. No lymphadenopathy. No thyromegaly. No thyroid nodules palpated.   RESPIRATORY: Lungs clear to auscultation. No use of accessory muscles to breathe. No rhonchi, rales, or wheeze heard.   CARDIOVASCULAR: S1, S2 tachycardic. No gallops, rubs, or murmurs heard. Carotid upstroke 2+ bilaterally. No bruits.   EXTREMITIES: Dorsalis pedis pulses 2+ bilaterally. No edema of the lower extremity.   ABDOMEN: Soft, nontender. No organomegaly/splenomegaly. Normoactive bowel sounds. No masses felt.   CHEST WALL: No pain to palpation except at the right upper chest. No lesion felt.   LYMPHATIC: No lymph nodes in the neck or right axilla.   MUSCULOSKELETAL: No clubbing, edema, or cyanosis.   SKIN: No rashes or ulcers seen.  NEUROLOGIC: Cranial nerves II through XII grossly intact. Deep tendon reflexes 2+ bilateral lower extremities.   PSYCHIATRIC: The patient is oriented to person, place, and time.   LABORATORY AND  RADIOLOGICAL DATA: CT scan of the chest showed no evidence of PE, nonspecific findings within the lung parenchyma described as ground-glass density throughout both hemithoraces, may represent a component of hypoventilation. No focal infiltrates appreciated.   White blood cell count 11.8, hemoglobin and hematocrit 15.6 and 46.9, platelet count 200. Chest x-ray was negative. Troponin negative. Glucose 87, BUN 15, creatinine 0.67, sodium 135, potassium 3.8, chloride 100, CO2 27, calcium 9.7. Liver function tests total protein slightly elevated at 8.5. EKG sinus tachycardia at 117 beats per minute.   ASSESSMENT AND PLAN:  1. Chest pain. With the patient's positive family history, will admit as an observation. Get a stress test in the a.m. Telemetry monitoring. Serial cardiac enzymes to rule out a myocardial infarction. Will give aspirin 81 mg daily. Will add metoprolol.  2. Malignant hypertension. The patient restarted her Teveten/HCT. I think she does have white coat syndrome also so I expect her blood pressures to be high during the hospitalization. Will give a stat dose of hydrochlorothiazide and add metoprolol 25 mg q.8 hours  3. Gastroesophageal reflux disease. Continue Prilosec.  4. Right chest wall pain with her history of breast cancer. She has a scheduled mammogram and follow-up with the breast surgeon, Dr. Jamal Collin, as outpatient.  5. Could be an anxiety component here with her family history. Will continue to monitor closely.   TIME SPENT ON ADMISSION: 55 minutes.   ____________________________ Tana Conch. Leslye Peer, MD rjw:drc D: 02/23/2011 19:40:00 ET T: 02/24/2011 06:07:23 ET JOB#: [id number]  cc: Tana Conch. Leslye Peer, MD, <Dictator> Leona Carry. Hall Busing, MD Marisue Brooklyn MD ELECTRONICALLY SIGNED 02/24/2011 15:26

## 2014-04-29 NOTE — Discharge Summary (Signed)
PATIENT NAME:  Autumn Salazar, Autumn Salazar MR#:  [id number] DATE OF BIRTH:  12-Jul-1955  DATE OF ADMISSION:  02/23/2011 DATE OF DISCHARGE:  02/24/2011  ADMITTING PHYSICIAN: Loletha Grayer, MD   DISCHARGING PHYSICIAN: Gladstone Lighter, MD    PRIMARY MD: Benita Stabile, MD   [place]: None.   DISCHARGE DIAGNOSES:  1. Chest pain likely secondary to gastritis as Myoview is negative.  2. Hypertension.  3. Gastroesophageal reflux disease.  4. History of Hodgkin's lymphoma.  5. History of breast cancer, status post MammoSite injection and radiation treatment.  6. Hyperlipidemia.  DISCHARGE HOME MEDICATIONS:  1. Teveten/HCTZ 600/12.5 mg 1 tablet p.o. daily.  2. Metoprolol 25 mg p.o. b.i.d.  3. Prilosec 20 mg p.o. b.i.d.   DISCHARGE DIET: Low sodium, low fat diet.   DISCHARGE ACTIVITY: As tolerated.   FOLLOW-UP INSTRUCTIONS:  1. Follow-up with PCP in 1 to 2 weeks.  2. The patient was also advised to follow-up with Dr. Rockey Situ and his information is provided in case of worsening tachycardia.   LABS AND IMAGING STUDIES: WBC 8.5, hemoglobin 13.1, hematocrit 39.5, platelet count 235, sodium 138, potassium 3.9, chloride 102, bicarb 29, BUN 11, creatinine 0.66, glucose 92, calcium 9.4. LDL 129, HDL 84, total cholesterol 234, triglycerides 105. TSH 4.28. Troponins have remained negative in the hospital. Urinalysis negative for any infection. CT of the chest for PE showing no evidence of PE, nonspecific findings within the lung parenchyma probably accentuated due to shallow inspiration, mild diffuse ground-glass densities throughout both lungs. Chest x-ray showing no evidence of any acute cardiopulmonary disease. Lexiscan Myoview negative, normal EF.  BRIEF HOSPITAL COURSE: Autumn Salazar is a pleasant 59 year old Caucasian female with past medical history of reflux esophagitis and strong family history of early cardiac disease who presented to the hospital with chest pain in the epigastric region  and also midsternal region that started yesterday. The patient has history of high blood pressure and took blood pressure medication in the past but she has been doing so well that she stopped her blood pressure medication about six months ago. Her blood pressure was elevated yesterday and also was tachycardic and presented to the hospital with chest pain.  1. Chest pain. She was initially admitted to rule out angina. She was on observation on tele monitoring without any arrhythmias. Her pain improved while she was in the hospital and she had a Myoview test done which was negative for any ischemic findings. Probably her pain was secondary to gastritis and she was advised to take her Prilosec twice a day. She was also advised to control her blood pressure and metoprolol has been added to her blood pressure medications along with Teveten and also HCTZ.  2. Hypertension. Continue home medication of Teveten/HCTZ and metoprolol has been added.  3. Gastroesophageal reflux disease. Continue Prilosec.  4. Right chest wall tenderness secondary to possible subcutaneous fibrous tissue formation from her previous MammoSite injection. She has an appointment to see Dr. Jamal Collin in the next couple of weeks and was advised to keep that. Also, the ground-glass density seen on her CT chest was probably secondary to radiation that she received for her breast cancer.   Her course has been otherwise uneventful in the hospital.   DISCHARGE CONDITION: Stable.   DISCHARGE DISPOSITION: Home.   TIME SPENT ON DISCHARGE: 40 minutes.  ____________________________ Gladstone Lighter, MD rk:drc D: 02/24/2011 15:45:28 ET T: 02/25/2011 09:34:14 ET JOB#: [id number] cc: Gladstone Lighter, MD, <Dictator>, Leona Carry. Hall Busing, MD Gladstone Lighter MD ELECTRONICALLY  SIGNED 02/25/2011 11:55

## 2014-06-12 ENCOUNTER — Ambulatory Visit (INDEPENDENT_AMBULATORY_CARE_PROVIDER_SITE_OTHER): Payer: PRIVATE HEALTH INSURANCE | Admitting: General Surgery

## 2014-06-12 ENCOUNTER — Ambulatory Visit: Payer: PRIVATE HEALTH INSURANCE

## 2014-06-12 ENCOUNTER — Encounter: Payer: Self-pay | Admitting: General Surgery

## 2014-06-12 VITALS — BP 152/82 | HR 92 | Resp 16 | Ht 63.0 in | Wt 171.0 lb

## 2014-06-12 DIAGNOSIS — E041 Nontoxic single thyroid nodule: Secondary | ICD-10-CM | POA: Diagnosis not present

## 2014-06-12 HISTORY — PX: FINE NEEDLE ASPIRATION: SHX406

## 2014-06-12 NOTE — Progress Notes (Signed)
Patient here today for Left thyroid biopsy. Pt reports no pressure symptoms. Exam- mild enlargement of both thyroid lobes. No lymphadenopathy. [place]- dominant nodule-one on each side about 2 cm size.. Both have a homogeneous appearance. Last time FNA-showed benign finding on right, follicular cells on left. FNA of both nodules repeated today.  If cytology is negative will discuss suppression with synthroid. Pt advised on all of this.

## 2014-06-12 NOTE — Patient Instructions (Addendum)
Keep area clean. Ice pack off and on today

## 2014-06-14 ENCOUNTER — Telehealth: Payer: Self-pay | Admitting: General Surgery

## 2014-06-14 NOTE — Telephone Encounter (Signed)
Got pt's voice mail. Aske to call back

## 2014-06-18 ENCOUNTER — Telehealth: Payer: Self-pay | Admitting: General Surgery

## 2014-06-18 NOTE — Telephone Encounter (Signed)
PT CALLED & WANTED HER RESULTS FROM HER THYROID BX DONE ON 06-12-14.PLEASE CALL [phone].

## 2014-06-20 ENCOUNTER — Other Ambulatory Visit: Payer: Self-pay | Admitting: *Deleted

## 2014-06-20 DIAGNOSIS — E041 Nontoxic single thyroid nodule: Secondary | ICD-10-CM

## 2014-06-20 NOTE — Progress Notes (Signed)
Patient needs a referral to Dr. Gabriel Carina at [facility] (Phone: [phone]: [phone]) regarding a thyroid nodule and repeat FNA.   Dr. Jamal Collin has already spoken to Dr. Gabriel Carina this morning. Patient aware that referral will be made.   Records have been forwarded to their office.   Pamala Hurry will be contacting the patient directly to arrange an afternoon appointment once she talks to Dr. Gabriel Carina.

## 2014-08-09 ENCOUNTER — Telehealth: Payer: Self-pay | Admitting: General Surgery

## 2014-08-09 NOTE — Telephone Encounter (Signed)
Message left on work and cell numbers for patient to call the office.

## 2014-08-09 NOTE — Telephone Encounter (Signed)
Receieved report from Dr. Gabriel Carina that both thyroid nodules were benign on cytopathology and gene testing. Pt asked to see me in 1 mo.

## 2014-08-13 NOTE — Telephone Encounter (Signed)
Patient has an appointment for 09-26-14 at 3:30 pm with Dr. Jamal Collin.

## 2014-09-26 ENCOUNTER — Ambulatory Visit (INDEPENDENT_AMBULATORY_CARE_PROVIDER_SITE_OTHER): Payer: PRIVATE HEALTH INSURANCE | Admitting: General Surgery

## 2014-09-26 ENCOUNTER — Encounter: Payer: Self-pay | Admitting: General Surgery

## 2014-09-26 VITALS — BP 120/74 | HR 74 | Resp 12 | Ht 63.0 in | Wt 170.0 lb

## 2014-09-26 DIAGNOSIS — E041 Nontoxic single thyroid nodule: Secondary | ICD-10-CM

## 2014-09-26 NOTE — Progress Notes (Signed)
Patient ID: Autumn Salazar, female   DOB: 10-03-1955, 59 y.o.   MRN: [id number]  Chief Complaint  Patient presents with  . Follow-up    HPI Autumn Salazar is a 59 y.o. female.  Here today for one month follow up fine needle aspirations of bilateral thyroid nodules that were benign. She states she is doing well. She can tell the nodules are there but not causing any trouble swallowing. She had an additional biopsy with genetic assessment at [facility] that was benign.  HPI  Past Medical History  Diagnosis Date  . Esophageal reflux   . History of hodgkin's lymphoma   . History of breast cancer 2007?  Marland Kitchen HLD (hyperlipidemia)   . HTN (hypertension) 2000  . Cancer 2008    right lumpectomy with sentinel node biopsy and radiation therapy    Past Surgical History  Procedure Laterality Date  . Other surgical history    . Biospy      of neck  . Breast lumpectomy  2007    right lumpectomy with sentinel node biopsy and radiation therapy  . Mammosite balloon placement   2008  . Lymph node biopsy  1979  . Abdominal hysterectomy  2000  . Colonoscopy  2012    Dr. Jamal Collin  . Upper gi endoscopy  2012    Dr. Vira Agar  . Fine needle aspiration Bilateral 06-12-14    thyroid nodules/BENIGN FOLLICULAR NODULE    Family History  Problem Relation Age of Onset  . Heart disease      strong family history  . Diabetes Mother     family history  . Heart attack Father 69    MI  . Heart failure Mother     Social History Social History  Substance Use Topics  . Smoking status: Never Smoker   . Smokeless tobacco: Never Used  . Alcohol Use: Yes     Comment: wine socially    No Known Allergies  Current Outpatient Prescriptions  Medication Sig Dispense Refill  . aspirin 81 MG tablet Take 81 mg by mouth daily.    . meloxicam (MOBIC) 15 MG tablet Take 1 tablet (15 mg total) by mouth daily. 30 tablet 3  . metoprolol tartrate (LOPRESSOR) 25 MG tablet Take 1 tablet (25 mg total) by mouth daily. 30  tablet 0  . omeprazole (PRILOSEC) 20 MG capsule Take 20 mg by mouth daily.     No current facility-administered medications for this visit.    Review of Systems Review of Systems  Constitutional: Negative.   Respiratory: Negative.   Cardiovascular: Negative.     Blood pressure 120/74, pulse 74, resp. rate 12, height 5\' 3"  (1.6 m), weight 170 lb (77.111 kg).  Physical Exam Physical Exam  Constitutional: She is oriented to person, place, and time. She appears well-developed and well-nourished.  HENT:  Mouth/Throat: Oropharynx is clear and moist.  Eyes: Conjunctivae are normal. No scleral icterus.  Neck: Neck supple.  No nodule palpable on either thyroid lobe.  Lymphadenopathy:    She has no cervical adenopathy.  Neurological: She is alert and oriented to person, place, and time.  Skin: Skin is warm and dry.  Psychiatric: Her behavior is normal.    Data Reviewed none  Assessment    No nodule palpable on either thyroid lobe. She had a dominant nodule in each lobe- both benign adenomas    Plan    Follow up as scheduled in March with mammogram and thyroid ultrasound.  PCP:  Trudie Reed 09/28/2014, 8:02 AM

## 2014-09-26 NOTE — Patient Instructions (Addendum)
The patient is aware to call back for any questions or concerns. Follow up as scheduled in March with mammogram and thyroid ultrasound.

## 2015-01-10 ENCOUNTER — Other Ambulatory Visit: Payer: Self-pay | Admitting: *Deleted

## 2015-01-10 DIAGNOSIS — Z853 Personal history of malignant neoplasm of breast: Secondary | ICD-10-CM

## 2015-03-14 ENCOUNTER — Ambulatory Visit: Payer: PRIVATE HEALTH INSURANCE | Admitting: General Surgery

## 2015-03-22 ENCOUNTER — Ambulatory Visit
Admission: RE | Admit: 2015-03-22 | Discharge: 2015-03-22 | Disposition: A | Discharge status: Home / Self Care | Payer: PRIVATE HEALTH INSURANCE | Source: Ambulatory Visit | Attending: General Surgery | Admitting: General Surgery

## 2015-03-22 ENCOUNTER — Other Ambulatory Visit: Payer: Self-pay | Admitting: General Surgery

## 2015-03-22 DIAGNOSIS — Z853 Personal history of malignant neoplasm of breast: Secondary | ICD-10-CM | POA: Diagnosis not present

## 2015-03-22 DIAGNOSIS — Z1231 Encounter for screening mammogram for malignant neoplasm of breast: Secondary | ICD-10-CM | POA: Diagnosis present

## 2015-03-27 ENCOUNTER — Encounter: Payer: Self-pay | Admitting: General Surgery

## 2015-03-27 ENCOUNTER — Ambulatory Visit: Payer: Self-pay

## 2015-03-27 ENCOUNTER — Ambulatory Visit (INDEPENDENT_AMBULATORY_CARE_PROVIDER_SITE_OTHER): Payer: PRIVATE HEALTH INSURANCE | Admitting: General Surgery

## 2015-03-27 VITALS — BP 150/82 | HR 86 | Resp 12 | Ht 63.0 in | Wt 169.0 lb

## 2015-03-27 DIAGNOSIS — Z8571 Personal history of Hodgkin lymphoma: Secondary | ICD-10-CM

## 2015-03-27 DIAGNOSIS — E041 Nontoxic single thyroid nodule: Secondary | ICD-10-CM

## 2015-03-27 DIAGNOSIS — Z853 Personal history of malignant neoplasm of breast: Secondary | ICD-10-CM | POA: Diagnosis not present

## 2015-03-27 NOTE — Patient Instructions (Addendum)
The patient is aware to call back for any questions or concerns. Dr. Hassell Done DEFRANCESCO Patient to return in one year bilateral screening mammogram Needs CA 27-29

## 2015-03-27 NOTE — Progress Notes (Signed)
Patient ID: Autumn Salazar, female   DOB: [birth year], 59 y.o.   MRN: [id number]  Chief Complaint  Patient presents with  . Follow-up    mammogram and thyroid nodule    HPI Autumn Salazar is a 60 y.o. female.  who presents for follow up breast cancer and a breast evaluation with follow up thyroid nodules. The most recent mammogram was done on 03-22-15.  Patient does perform regular self breast checks and gets regular mammograms done.   No new breast or swallowing issue. FNA thyroid nodule was benign.  HPI  Past Medical History  Diagnosis Date  . Esophageal reflux   . History of hodgkin's lymphoma   . History of breast cancer 2007?  Marland Kitchen HLD (hyperlipidemia)   . HTN (hypertension) 2000  . Cancer [facility]) 2008    right lumpectomy with sentinel node biopsy and radiation therapy    Past Surgical History  Procedure Laterality Date  . Other surgical history    . Biospy      of neck  . Breast lumpectomy  2007    right lumpectomy with sentinel node biopsy and radiation therapy  . Mammosite balloon placement   2008  . Lymph node biopsy  1979  . Abdominal hysterectomy  2000  . Colonoscopy  2012    Dr. Jamal Collin  . Upper gi endoscopy  2012    Dr. Vira Agar  . Fine needle aspiration Bilateral 06-12-14    thyroid nodules/BENIGN FOLLICULAR NODULE  . Breast biopsy Left 2007    breast cancer and rad    Family History  Problem Relation Age of Onset  . Heart disease      strong family history  . Diabetes Mother     family history  . Heart attack Father 32    MI  . Heart failure Mother     Social History Social History  Substance Use Topics  . Smoking status: Never Smoker   . Smokeless tobacco: Never Used  . Alcohol Use: Yes     Comment: wine socially    No Known Allergies  Current Outpatient Prescriptions  Medication Sig Dispense Refill  . aspirin 81 MG tablet Take 81 mg by mouth daily.    . meloxicam (MOBIC) 15 MG tablet Take 1 tablet (15 mg total) by mouth daily. 30 tablet 3  .  metoprolol tartrate (LOPRESSOR) 25 MG tablet Take 1 tablet (25 mg total) by mouth daily. 30 tablet 0  . omeprazole (PRILOSEC) 20 MG capsule Take 20 mg by mouth daily.     No current facility-administered medications for this visit.    Review of Systems Review of Systems  Constitutional: Negative.   Respiratory: Negative.   Cardiovascular: Negative.     Blood pressure 150/82, pulse 86, resp. rate 12, height 5\' 3"  (1.6 m), weight 169 lb (76.658 kg).  Physical Exam Physical Exam  Constitutional: She is oriented to person, place, and time. She appears well-developed and well-nourished.  HENT:  Mouth/Throat: Oropharynx is clear and moist.  Eyes: Conjunctivae are normal. No scleral icterus.  Neck: Neck supple.  Previously noted nodules in both lobes of thyroid appear to be resolved, they are not palpable.  Cardiovascular: Normal rate, regular rhythm and normal heart sounds.   Pulmonary/Chest: Effort normal and breath sounds normal. Right breast exhibits no inverted nipple, no mass, no nipple discharge, no skin change and no tenderness. Left breast exhibits no inverted nipple, no mass, no nipple discharge, no skin change and no tenderness.  Palpable firm  area left breast at lumpectomy site unchanged from before.  Abdominal: Soft. There is no tenderness.  Lymphadenopathy:    She has no cervical adenopathy.    She has no axillary adenopathy.  Neurological: She is alert and oriented to person, place, and time.  Skin: Skin is warm and dry.  Psychiatric: Her behavior is normal.    Data Reviewed Mammogram reviewed, prior progress notes.  Assessment    Stable exam . Patient is now post 9 year right breast cancer treatment. Thyroid nodules appear resolved for now       Plan    Patient to return in one year bilateral screening mammogram.        PCP:  Benita Stabile  This information has been scribed by Karie Fetch RNBC.   SANKAR,SEEPLAPUTHUR G 03/27/2015, 4:51 PM

## 2015-04-01 ENCOUNTER — Other Ambulatory Visit: Payer: Self-pay | Admitting: Cardiovascular Disease

## 2015-04-04 ENCOUNTER — Ambulatory Visit: Payer: PRIVATE HEALTH INSURANCE | Admitting: General Surgery

## 2015-04-04 ENCOUNTER — Other Ambulatory Visit: Payer: Self-pay | Admitting: Cardiovascular Disease

## 2016-02-04 ENCOUNTER — Other Ambulatory Visit: Payer: Self-pay

## 2016-02-04 DIAGNOSIS — Z1231 Encounter for screening mammogram for malignant neoplasm of breast: Secondary | ICD-10-CM

## 2016-03-23 ENCOUNTER — Ambulatory Visit
Admission: RE | Admit: 2016-03-23 | Discharge: 2016-03-23 | Disposition: A | Discharge status: Home / Self Care | Payer: PRIVATE HEALTH INSURANCE | Source: Ambulatory Visit | Attending: General Surgery | Admitting: General Surgery

## 2016-03-23 DIAGNOSIS — Z1231 Encounter for screening mammogram for malignant neoplasm of breast: Secondary | ICD-10-CM | POA: Diagnosis present

## 2016-04-02 ENCOUNTER — Ambulatory Visit: Payer: PRIVATE HEALTH INSURANCE | Admitting: General Surgery

## 2016-05-07 ENCOUNTER — Encounter: Payer: Self-pay | Admitting: *Deleted

## 2017-03-03 ENCOUNTER — Other Ambulatory Visit: Payer: Self-pay | Admitting: Internal Medicine

## 2017-03-03 ENCOUNTER — Telehealth: Payer: Self-pay | Admitting: General Surgery

## 2017-03-03 DIAGNOSIS — Z1231 Encounter for screening mammogram for malignant neoplasm of breast: Secondary | ICD-10-CM

## 2017-03-03 NOTE — Telephone Encounter (Signed)
Patient called & stated she would call her primary care doctor and have her mammograms ordered by them.She will call us if needs surgery.Old patient of Dr Angie Fava.

## 2017-03-26 ENCOUNTER — Ambulatory Visit
Admission: RE | Admit: 2017-03-26 | Discharge: 2017-03-26 | Disposition: A | Discharge status: Home / Self Care | Payer: PRIVATE HEALTH INSURANCE | Source: Ambulatory Visit | Attending: Internal Medicine | Admitting: Internal Medicine

## 2017-03-26 DIAGNOSIS — Z1231 Encounter for screening mammogram for malignant neoplasm of breast: Secondary | ICD-10-CM | POA: Insufficient documentation

## 2017-03-26 HISTORY — DX: Personal history of irradiation: Z92.3

## 2017-08-20 ENCOUNTER — Telehealth: Payer: Self-pay | Admitting: Cardiovascular Disease

## 2017-08-20 NOTE — Telephone Encounter (Signed)
New Message  Pt verbalized to cancel request in regards to Medical Records

## 2018-03-04 ENCOUNTER — Other Ambulatory Visit: Payer: Self-pay | Admitting: Internal Medicine

## 2018-03-04 DIAGNOSIS — Z1231 Encounter for screening mammogram for malignant neoplasm of breast: Secondary | ICD-10-CM

## 2018-06-08 ENCOUNTER — Other Ambulatory Visit: Payer: Self-pay

## 2018-06-08 ENCOUNTER — Ambulatory Visit
Admission: RE | Admit: 2018-06-08 | Discharge: 2018-06-08 | Disposition: A | Discharge status: Home / Self Care | Payer: PRIVATE HEALTH INSURANCE | Source: Ambulatory Visit | Attending: Internal Medicine | Admitting: Internal Medicine

## 2018-06-08 DIAGNOSIS — Z1231 Encounter for screening mammogram for malignant neoplasm of breast: Secondary | ICD-10-CM | POA: Insufficient documentation

## 2019-02-09 ENCOUNTER — Other Ambulatory Visit: Payer: Self-pay | Admitting: Internal Medicine

## 2019-02-09 DIAGNOSIS — Z1231 Encounter for screening mammogram for malignant neoplasm of breast: Secondary | ICD-10-CM

## 2019-03-10 ENCOUNTER — Ambulatory Visit: Payer: Self-pay

## 2019-03-10 ENCOUNTER — Ambulatory Visit: Payer: PRIVATE HEALTH INSURANCE | Attending: Internal Medicine

## 2019-03-16 ENCOUNTER — Ambulatory Visit: Payer: PRIVATE HEALTH INSURANCE

## 2019-03-16 ENCOUNTER — Ambulatory Visit: Payer: PRIVATE HEALTH INSURANCE | Attending: Internal Medicine

## 2019-03-16 DIAGNOSIS — Z23 Encounter for immunization: Secondary | ICD-10-CM

## 2019-03-16 NOTE — Progress Notes (Signed)
   Covid-19 Vaccination Clinic  Name:  Autumn Salazar    MRN: [id number] DOB: 1955/10/18  03/16/2019  [name] was observed post Covid-19 immunization for 15 minutes without incident. She was provided with Vaccine Information Sheet and instruction to access the V-Safe system.   [name] was instructed to call 911 with any severe reactions post vaccine: Marland Kitchen Difficulty breathing  . Swelling of face and throat  . A fast heartbeat  . A bad rash all over body  . Dizziness and weakness   Immunizations Administered    Name Date Dose VIS Date Route   Pfizer COVID-19 Vaccine 03/16/2019 10:43 AM 0.3 mL 12/16/2018 Intramuscular   Manufacturer: [place]   Lot: [id number]   [place]: [id number]

## 2019-04-04 ENCOUNTER — Ambulatory Visit: Payer: PRIVATE HEALTH INSURANCE | Attending: Internal Medicine

## 2019-04-04 DIAGNOSIS — Z23 Encounter for immunization: Secondary | ICD-10-CM

## 2019-04-04 NOTE — Progress Notes (Signed)
   Covid-19 Vaccination Clinic  Name:  Autumn Salazar    MRN: [id number] DOB: [birth year]  04/04/2019  [name] was observed post Covid-19 immunization for 15 minutes without incident. She was provided with Vaccine Information Sheet and instruction to access the V-Safe system.   [name] was instructed to call 911 with any severe reactions post vaccine: Marland Kitchen Difficulty breathing  . Swelling of face and throat  . A fast heartbeat  . A bad rash all over body  . Dizziness and weakness   Immunizations Administered    Name Date Dose VIS Date Route   Pfizer COVID-19 Vaccine 04/04/2019  1:14 PM 0.3 mL 12/16/2018 Intramuscular   Manufacturer: [place]   Lot: [id number]   [place]: [id number]

## 2019-06-09 ENCOUNTER — Ambulatory Visit
Admission: RE | Admit: 2019-06-09 | Discharge: 2019-06-09 | Disposition: A | Discharge status: Home / Self Care | Payer: BC Managed Care – PPO | Source: Ambulatory Visit | Attending: Internal Medicine | Admitting: Internal Medicine

## 2019-06-09 DIAGNOSIS — Z1231 Encounter for screening mammogram for malignant neoplasm of breast: Secondary | ICD-10-CM | POA: Diagnosis present

## 2020-03-13 ENCOUNTER — Other Ambulatory Visit: Payer: Self-pay | Admitting: Family Medicine

## 2020-03-13 DIAGNOSIS — M5412 Radiculopathy, cervical region: Secondary | ICD-10-CM

## 2020-03-27 ENCOUNTER — Ambulatory Visit
Admission: RE | Admit: 2020-03-27 | Discharge: 2020-03-27 | Disposition: A | Discharge status: Home / Self Care | Payer: BC Managed Care – PPO | Source: Ambulatory Visit | Attending: Family Medicine | Admitting: Family Medicine

## 2020-03-27 ENCOUNTER — Other Ambulatory Visit: Payer: Self-pay

## 2020-03-27 DIAGNOSIS — M5412 Radiculopathy, cervical region: Secondary | ICD-10-CM

## 2020-05-02 ENCOUNTER — Other Ambulatory Visit: Payer: Self-pay | Admitting: Internal Medicine

## 2020-05-02 DIAGNOSIS — Z1231 Encounter for screening mammogram for malignant neoplasm of breast: Secondary | ICD-10-CM

## 2020-06-10 ENCOUNTER — Other Ambulatory Visit: Payer: Self-pay

## 2020-06-10 ENCOUNTER — Ambulatory Visit
Admission: RE | Admit: 2020-06-10 | Discharge: 2020-06-10 | Disposition: A | Discharge status: Home / Self Care | Payer: BC Managed Care – PPO | Source: Ambulatory Visit | Attending: Internal Medicine | Admitting: Internal Medicine

## 2020-06-10 DIAGNOSIS — Z1231 Encounter for screening mammogram for malignant neoplasm of breast: Secondary | ICD-10-CM | POA: Insufficient documentation

## 2020-07-05 IMAGING — MG DIGITAL SCREENING BILATERAL MAMMOGRAM WITH TOMO AND CAD
8 series · 8 of 24 positions shown · non-contrast
Comparison: Previous exam(s).

CLINICAL DATA: Screening.

EXAM:
DIGITAL SCREENING BILATERAL MAMMOGRAM WITH TOMO AND CAD

[L MLO synth-2D]
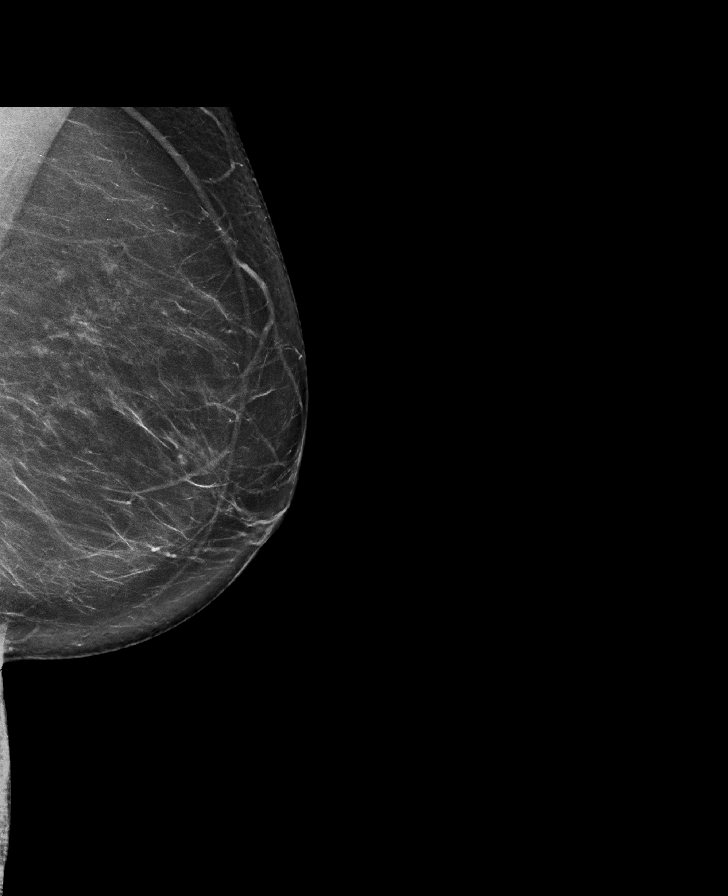

[L CC synth-2D]
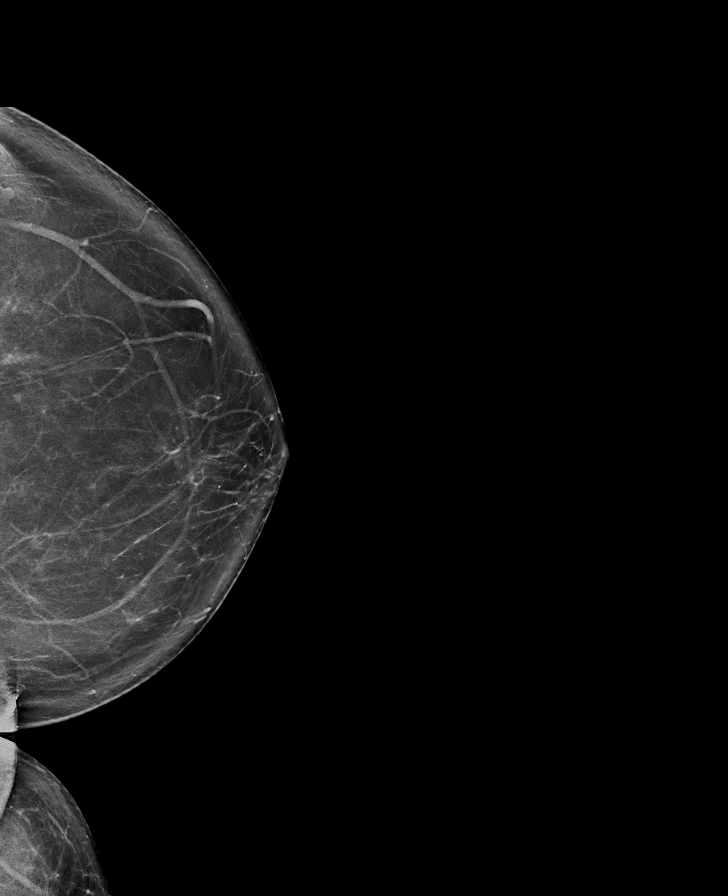

[R CC synth-2D]
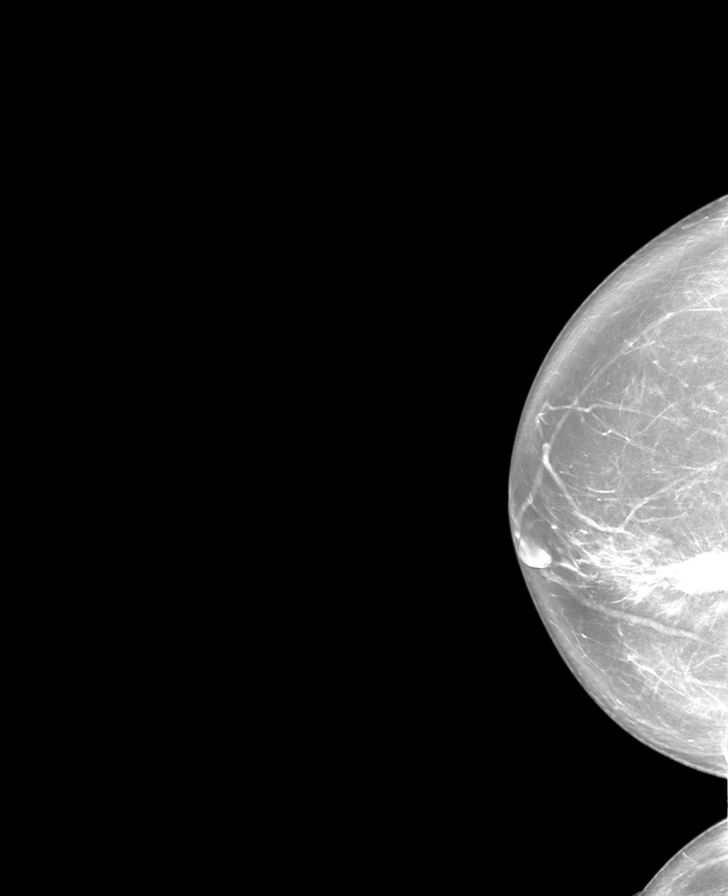

[R MLO synth-2D]
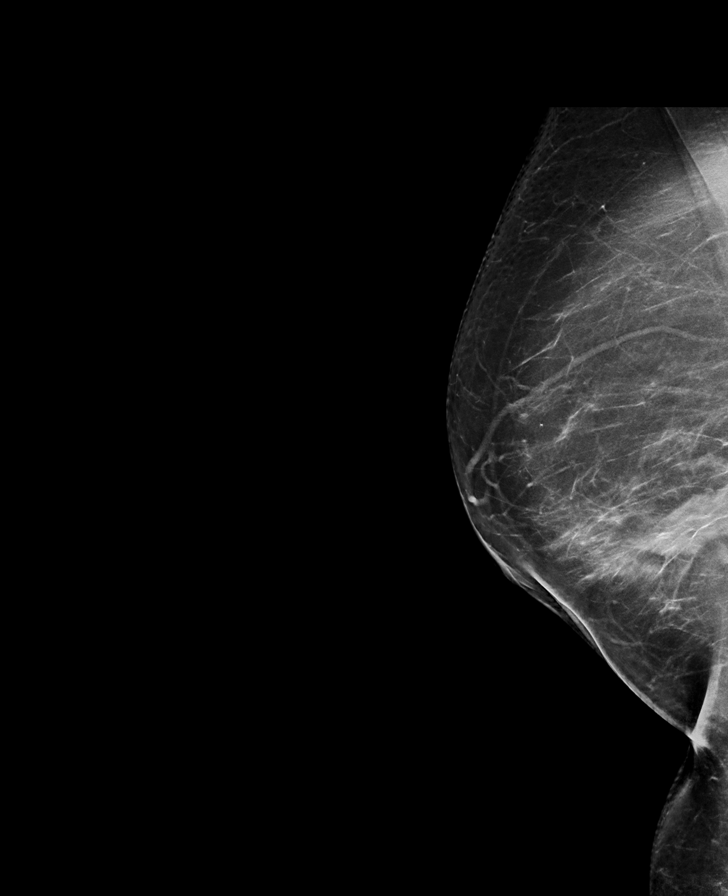

[R MLO tomo · tomo slice 44/87.0]
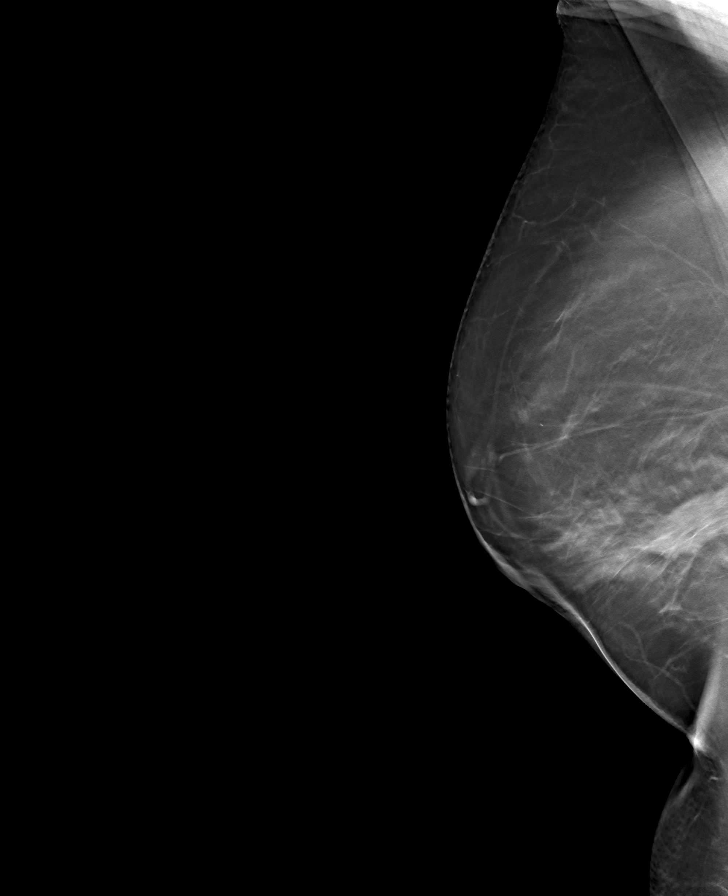

[L CC tomo · tomo slice 41/82.0]
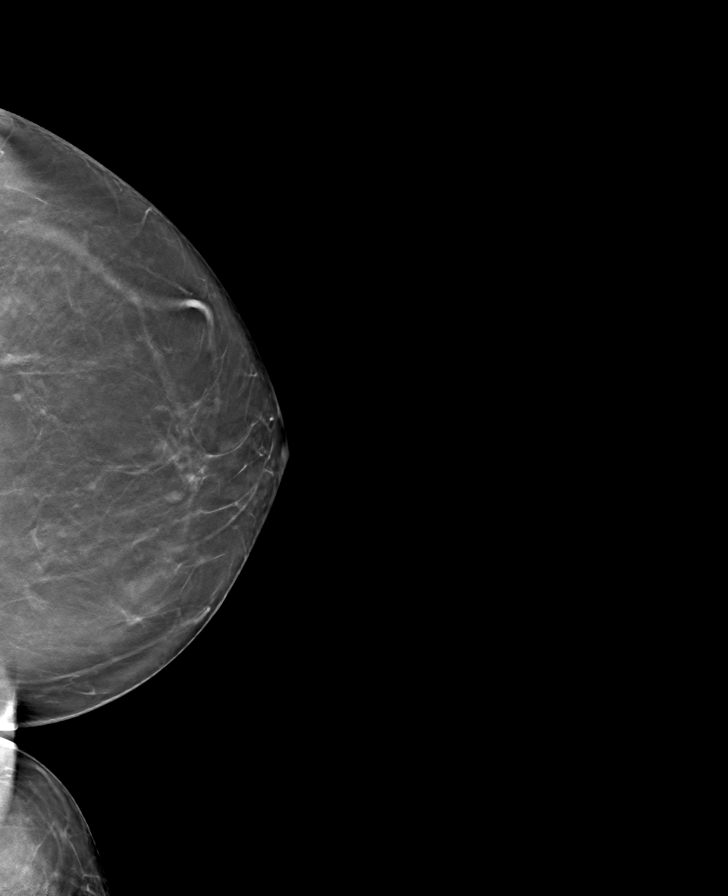

[R CC tomo · tomo slice 43/86.0]
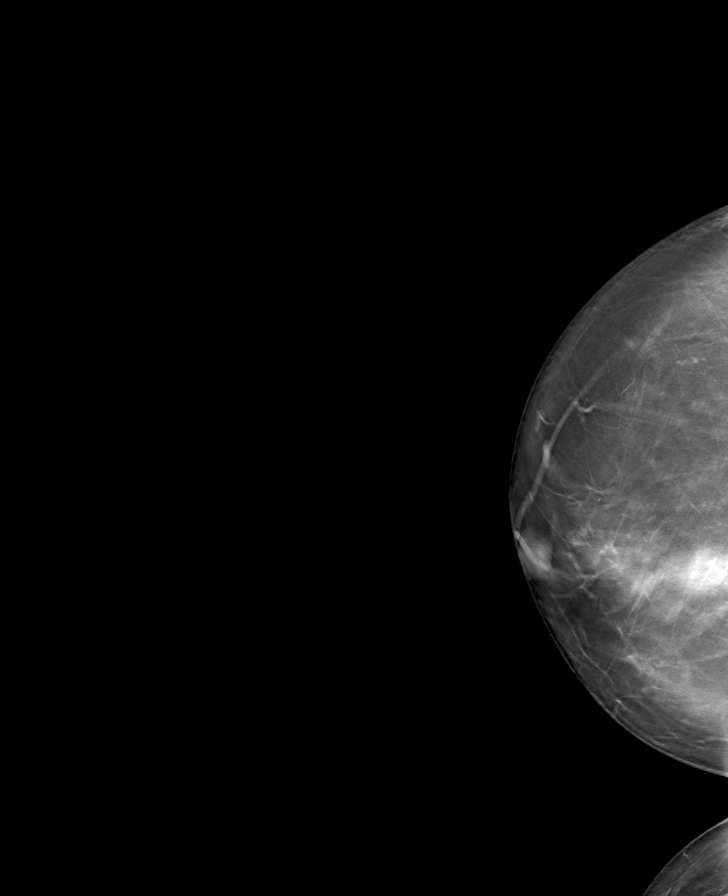

[L MLO tomo · tomo slice 43/84.0]
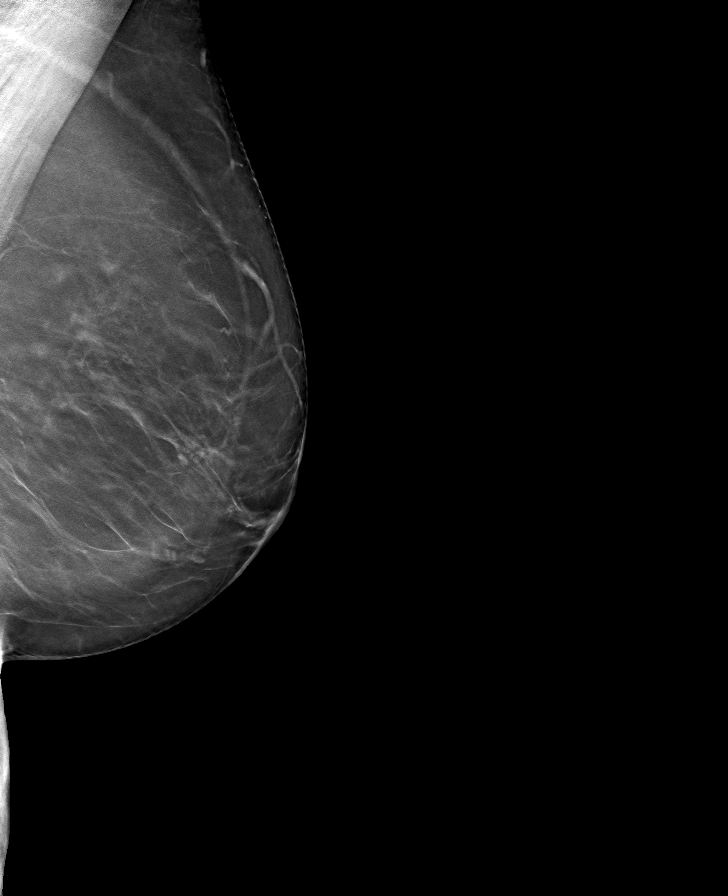

[8 of 24 positions shown; findings below may reference images not displayed]

ACR Breast Density Category b: There are scattered areas of
fibroglandular density.
FINDINGS: There are no findings suspicious for malignancy. Images were
processed with CAD.
IMPRESSION: No mammographic evidence of malignancy. A result letter of this
screening mammogram will be mailed directly to the patient.

RECOMMENDATION:
Screening mammogram in one year. (Code:[id number])

BI-RADS CATEGORY  1: Negative.

## 2020-09-26 ENCOUNTER — Other Ambulatory Visit: Payer: Self-pay | Admitting: General Surgery

## 2020-09-27 ENCOUNTER — Other Ambulatory Visit: Payer: Self-pay | Admitting: General Surgery

## 2020-09-27 NOTE — Progress Notes (Signed)
Subjective:     Patient ID: Autumn Salazar is a 65 y.o. female.   HPI   The following portions of the patient's history were reviewed and updated as appropriate.   This is a new patient that is here today for: office visit. Patient has been referred by Dr. Hall Busing for evaluation of a colonoscopy. She denies any rectal bleeding or mucus. Patient has bowel movements once daily.    The patient's last general surgery follow-up with Dr. Jamal Collin was in 2017.  Dr. Hall Busing has been arranging for her mammograms, but she has not had clinical breast exams.   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        Chief Complaint  Patient presents with   Pre-op Exam      BP 132/78   Pulse (!) 117   Temp 36.7 C (98 F)   Ht 160 cm (5' 2.99")   Wt 70.8 kg (156 lb)   SpO2 99%   BMI 27.64 kg/m        Past Medical History:  Diagnosis Date   Breast cancer (CMS-HCC) 2008    right   Esophageal reflux     History of Hodgkin's lymphoma     Hyperlipidemia     Hypertension     Personal history of radiation therapy 2008           Past Surgical History:  Procedure Laterality Date   BREAST EXCISIONAL BIOPSY Left 2007   COLONOSCOPY   2012   EGD   2012   FINE NEEDLE ASPIRATION Bilateral 06/12/2014    thyroid   history of lymph node biopsy   1979   HYSTERECTOMY VAGINAL       mammosite balloon placement   2008   MASTECTOMY PARTIAL / LUMPECTOMY Right 2007                OB History     Gravida  0   Para  0   Term  0   Preterm  0   AB  0   Living  0      SAB  0   IAB  0   Ectopic  0   Molar  0   Multiple  0   Live Births  0        Obstetric Comments  Age at first period 39               Social History          Socioeconomic History   Marital status: Married  Tobacco Use   Smoking status: Never Smoker   Smokeless tobacco: Never Used  Substance and Sexual Activity   Alcohol use: Never   Drug use:  Never        No Known Allergies   Current Medications        Current Outpatient Medications  Medication Sig Dispense Refill   amLODIPine (NORVASC) 2.5 MG tablet Take 2.5 mg by mouth once daily       cetirizine (ZYRTEC) 10 MG tablet Take 10 mg by mouth once daily       hydroCHLOROthiazide (HYDRODIURIL) 25 MG tablet Take 25 mg by mouth once daily       losartan (COZAAR) 50 MG tablet Take 25 mg by mouth once daily       aspirin 81 MG EC tablet Take 81 mg by mouth once daily  diazePAM (VALIUM) 2 MG tablet 1 po 30 minutes before ESI (Patient not taking: Reported on 09/26/2020) 1 tablet 0   meloxicam (MOBIC) 15 MG tablet Take 15 mg by mouth as directed for Pain       methocarbamoL (ROBAXIN) 500 MG tablet  (Patient not taking: Reported on 09/26/2020)        No current facility-administered medications for this visit.             Family History  Problem Relation Age of Onset   Diabetes Mother     High blood pressure (Hypertension) Mother     COPD Mother     Anemia Mother     Heart failure Mother     Myocardial Infarction (Heart attack) Father     High blood pressure (Hypertension) Sister     COPD Sister     Anemia Sister     Heart failure Sister     Brain cancer Brother     High blood pressure (Hypertension) Brother     Breast cancer Neg Hx     Colon cancer Neg Hx        Review of Systems  Constitutional: Negative for chills and fever.  Respiratory: Negative for cough.          Objective:   Physical Exam Exam conducted with a chaperone present.  Constitutional:      Appearance: Normal appearance.  Cardiovascular:     Rate and Rhythm: Normal rate and regular rhythm.     Pulses: Normal pulses.     Heart sounds: Murmur heard.  Pulmonary:     Effort: Pulmonary effort is normal.     Breath sounds: Normal breath sounds.  Musculoskeletal:     Cervical back: Neck supple.  Skin:    General: Skin is warm and dry.  Neurological:     Mental Status: She is alert and  oriented to person, place, and time.  Psychiatric:        Mood and Affect: Mood normal.        Behavior: Behavior normal.           Assessment:     Candidate for colon cancer screening.    Plan:     She has no family health history for colon cancer or high-grade polyps, but her personal history of breast cancer and Hodgkin's would encourage me to lean towards colonoscopy rather than Cologuard testing.  Risks of the procedure were reviewed and she is amenable to proceed.   Considering her past history of mantle radiation, she is at higher risk for sarcomas of the breast and may be a candidate for high risk protocol.  We will make follow-up appointment to review her clinical exam and mammograms as well as literature to review to be sure she is being screened appropriately.   Encouraged to make sure she is up-to-date with her Pneumovax vaccine to minimize the risk of sepsis.    This note is partially prepared by Ledell Noss, CMA acting as a scribe in the presence of Dr. Hervey Ard, MD.    The documentation recorded by the scribe accurately reflects the service I personally performed and the decisions made by me.    Robert Bellow, MD FACS

## 2020-11-05 ENCOUNTER — Encounter: Payer: Self-pay | Admitting: General Surgery

## 2020-11-06 ENCOUNTER — Ambulatory Visit: Payer: BC Managed Care – PPO | Admitting: Certified Registered"

## 2020-11-06 ENCOUNTER — Ambulatory Visit
Admission: RE | Admit: 2020-11-06 | Discharge: 2020-11-06 | Disposition: A | Discharge status: Home / Self Care | Payer: BC Managed Care – PPO | Source: Ambulatory Visit | Attending: General Surgery | Admitting: General Surgery

## 2020-11-06 ENCOUNTER — Encounter
Admission: RE | Disposition: A | Discharge status: Home / Self Care | Payer: Self-pay | Source: Ambulatory Visit | Attending: General Surgery

## 2020-11-06 ENCOUNTER — Encounter: Payer: Self-pay | Admitting: General Surgery

## 2020-11-06 DIAGNOSIS — Z791 Long term (current) use of non-steroidal anti-inflammatories (NSAID): Secondary | ICD-10-CM | POA: Diagnosis not present

## 2020-11-06 DIAGNOSIS — K635 Polyp of colon: Secondary | ICD-10-CM | POA: Diagnosis not present

## 2020-11-06 DIAGNOSIS — Z923 Personal history of irradiation: Secondary | ICD-10-CM | POA: Diagnosis not present

## 2020-11-06 DIAGNOSIS — K5730 Diverticulosis of large intestine without perforation or abscess without bleeding: Secondary | ICD-10-CM | POA: Diagnosis not present

## 2020-11-06 DIAGNOSIS — Z1211 Encounter for screening for malignant neoplasm of colon: Secondary | ICD-10-CM | POA: Diagnosis present

## 2020-11-06 DIAGNOSIS — Z853 Personal history of malignant neoplasm of breast: Secondary | ICD-10-CM | POA: Diagnosis not present

## 2020-11-06 DIAGNOSIS — Z7982 Long term (current) use of aspirin: Secondary | ICD-10-CM | POA: Diagnosis not present

## 2020-11-06 DIAGNOSIS — Z8571 Personal history of Hodgkin lymphoma: Secondary | ICD-10-CM | POA: Diagnosis not present

## 2020-11-06 HISTORY — PX: COLONOSCOPY WITH PROPOFOL: SHX5780

## 2020-11-06 SURGERY — COLONOSCOPY WITH PROPOFOL
Anesthesia: General

## 2020-11-06 MED ORDER — PROPOFOL 10 MG/ML IV BOLUS
INTRAVENOUS | Status: DC | PRN
  Administered 2020-11-06: 50 mg via INTRAVENOUS

## 2020-11-06 MED ORDER — SPOT INK MARKER SYRINGE KIT
PACK | SUBMUCOSAL | Status: DC | PRN
  Administered 2020-11-06: 2 mL via SUBMUCOSAL

## 2020-11-06 MED ORDER — SODIUM CHLORIDE 0.9 % IV SOLN
INTRAVENOUS | Status: DC
  Administered 2020-11-06: 1000 mL via INTRAVENOUS

## 2020-11-06 MED ORDER — PROPOFOL 500 MG/50ML IV EMUL
INTRAVENOUS | Status: AC
  Filled 2020-11-06: qty 50

## 2020-11-06 MED ORDER — PROPOFOL 500 MG/50ML IV EMUL
INTRAVENOUS | Status: DC | PRN
  Administered 2020-11-06: 150 ug/kg/min via INTRAVENOUS

## 2020-11-06 NOTE — Op Note (Signed)
[facility] Patient Name: Autumn Salazar Procedure Date: 11/06/2020 9:18 AM MRN: [id number] Account #: [id number] Date of Birth: [birth year] Admit Type: Outpatient Age: 65 Room: [facility] ENDO ROOM 1 Gender: Female Note Status: Finalized Instrument Name: Colonoscope [id number] Procedure:             Colonoscopy Indications:           Screening for colorectal malignant neoplasm Providers:             Robert Bellow, MD Referring MD:          Leona Carry. Hall Busing, MD (Referring MD) Medicines:             Propofol per Anesthesia Complications:         No immediate complications. Procedure:             Pre-Anesthesia Assessment:                        - Prior to the procedure, a History and Physical was                         performed, and patient medications, allergies and                         sensitivities were reviewed. The patient's tolerance                         of previous anesthesia was reviewed.                        - The risks and benefits of the procedure and the                         sedation options and risks were discussed with the                         patient. All questions were answered and informed                         consent was obtained.                        After obtaining informed consent, the colonoscope was                         passed under direct vision. Throughout the procedure,                         the patient's blood pressure, pulse, and oxygen                         saturations were monitored continuously. The                         Colonoscope was introduced through the anus and                         advanced to the the terminal ileum. The colonoscopy  was performed without difficulty. The patient                         tolerated the procedure well. The quality of the bowel                         preparation was excellent. Findings:      A few small-mouthed diverticula were found  in the hepatic flexure.      A 15 mm polyp was found in the mid transverse colon. The polyp was       pedunculated. The polyp was removed with a hot snare. Resection and       retrieval were complete. Area was successfully injected with 2 mL [place]       ink for tattooing.      The retroflexed view of the distal rectum and anal verge was normal and       showed no anal or rectal abnormalities. Impression:            - Diverticulosis at the hepatic flexure.                        - One 15 mm polyp in the mid transverse colon, removed                         with a hot snare. Resected and retrieved. Injected.                        - The distal rectum and anal verge are normal on                         retroflexion view. Recommendation:        - Telephone endoscopist for pathology results in 1                         week. Procedure Code(s):     --- Professional ---                        [phone], Colonoscopy, flexible; with removal of                         tumor(s), polyp(s), or other lesion(s) by snare                         technique                        [id number], Colonoscopy, flexible; with directed submucosal                         injection(s), any substance Diagnosis Code(s):     --- Professional ---                        Z12.11, Encounter for screening for malignant neoplasm                         of colon                        K63.5, Polyp of  colon                        K57.30, Diverticulosis of large intestine without                         perforation or abscess without bleeding CPT copyright 2019 American Medical Association. All rights reserved. The codes documented in this report are preliminary and upon coder review may  be revised to meet current compliance requirements. Robert Bellow, MD 11/06/2020 9:59:08 AM This report has been signed electronically. Number of Addenda: 0 Note Initiated On: 11/06/2020 9:18 AM Scope Withdrawal Time: 0 hours 14 minutes 13  seconds  Total Procedure Duration: 0 hours 19 minutes 45 seconds  Estimated Blood Loss:  Estimated blood loss: none.      [id number] [facility]

## 2020-11-06 NOTE — Anesthesia Preprocedure Evaluation (Signed)
Anesthesia Evaluation  Patient identified by MRN, date of birth, ID band Patient awake    Reviewed: Allergy & Precautions, H&P , NPO status , Patient's Chart, lab work & pertinent test results, reviewed documented beta blocker date and time   History of Anesthesia Complications Negative for: history of anesthetic complications  Airway Mallampati: I  TM Distance: >3 FB Neck ROM: full    Dental  (+) Dental Advidsory Given, Caps, Teeth Intact   Pulmonary neg pulmonary ROS,    Pulmonary exam normal breath sounds clear to auscultation       Cardiovascular Exercise Tolerance: Good hypertension, (-) angina(-) Past MI and (-) Cardiac Stents Normal cardiovascular exam(-) dysrhythmias + Valvular Problems/Murmurs  Rhythm:regular Rate:Normal     Neuro/Psych negative neurological ROS  negative psych ROS   GI/Hepatic Neg liver ROS, GERD  ,  Endo/Other  negative endocrine ROS  Renal/GU negative Renal ROS  negative genitourinary   Musculoskeletal   Abdominal   Peds  Hematology negative hematology ROS (+)   Anesthesia Other Findings Past Medical History: 2007: Breast cancer ([place]) 2008: Cancer ([place])     Comment:  right lumpectomy with sentinel node biopsy and radiation              therapy No date: Esophageal reflux 2007?: History of breast cancer No date: History of Hodgkin's lymphoma No date: HLD (hyperlipidemia) 2000: HTN (hypertension) 2008: Personal history of radiation therapy     Comment:  RIGHT lumpectomy   Reproductive/Obstetrics negative OB ROS                             Anesthesia Physical Anesthesia Plan  ASA: 2  Anesthesia Plan: General   Post-op Pain Management:    Induction: Intravenous  PONV Risk Score and Plan: 3 and TIVA and Propofol infusion  Airway Management Planned: Natural Airway and Nasal Cannula  Additional Equipment:   Intra-op Plan:   Post-operative Plan:    Informed Consent: I have reviewed the patients History and Physical, chart, labs and discussed the procedure including the risks, benefits and alternatives for the proposed anesthesia with the patient or authorized representative who has indicated his/her understanding and acceptance.     Dental Advisory Given  Plan Discussed with: Anesthesiologist, CRNA and Surgeon  Anesthesia Plan Comments:         Anesthesia Quick Evaluation

## 2020-11-06 NOTE — H&P (Signed)
JALEENA VIVIANI [id number] 1955/11/21     HPI: Healthy 65 y/o woman for screening colonoscopy. Tolerated prep well.   Medications Prior to Admission  Medication Sig Dispense Refill Last Dose   aspirin 81 MG tablet Take 81 mg by mouth daily.   Past Week   meloxicam (MOBIC) 15 MG tablet Take 1 tablet (15 mg total) by mouth daily. 30 tablet 3 Past Week   metoprolol tartrate (LOPRESSOR) 25 MG tablet TAKE ONE TABLET TWICE DAILY 180 tablet 3 11/06/2020 at 0630   omeprazole (PRILOSEC) 20 MG capsule Take 20 mg by mouth daily.   11/06/2020 at 0630   No Known Allergies Past Medical History:  Diagnosis Date   Breast cancer ([place]) 2007   Cancer [facility]) 2008   right lumpectomy with sentinel node biopsy and radiation therapy   Esophageal reflux    History of breast cancer 2007?   History of Hodgkin's lymphoma    HLD (hyperlipidemia)    HTN (hypertension) 2000   Personal history of radiation therapy 2008   RIGHT lumpectomy   Past Surgical History:  Procedure Laterality Date   ABDOMINAL HYSTERECTOMY  2000   biospy     of neck   BREAST BIOPSY Left 2007   breast cancer and rad   BREAST LUMPECTOMY Right 2007   right lumpectomy with sentinel node biopsy and radiation therapy   COLONOSCOPY  2012   Dr. Jamal Collin   FINE NEEDLE ASPIRATION Bilateral 06-12-14   thyroid nodules/BENIGN [place]   mammosite balloon placement   2008   OTHER SURGICAL HISTORY     UPPER GI ENDOSCOPY  2012   Dr. Vira Agar   Social History   Socioeconomic History   Marital status: Married    Spouse name: Not on file   Number of children: Not on file   Years of education: Not on file   Highest education level: Not on file  Occupational History   Not on file  Tobacco Use   Smoking status: Never    Passive exposure: Past   Smokeless tobacco: Never  Vaping Use   Vaping Use: Never used  Substance and Sexual Activity   Alcohol use: Yes    Comment: wine socially   Drug use: No   Sexual  activity: Not on file  Other Topics Concern   Not on file  Social History Narrative   Not on file   Social Determinants of Health   Financial Resource Strain: Not on file  Food Insecurity: Not on file  Transportation Needs: Not on file  Physical Activity: Not on file  Stress: Not on file  Social Connections: Not on file  Intimate Partner Violence: Not on file   Social History   Social History Narrative   Not on file     ROS: Negative.     PE: HEENT: Negative. Lungs: Clear. Cardio: RR.  Assessment/Plan:  Proceed with planned endoscopy.   Forest Gleason [facility] 11/06/2020

## 2020-11-06 NOTE — Transfer of Care (Signed)
Immediate Anesthesia Transfer of Care Note  Patient: Autumn Salazar  Procedure(s) Performed: COLONOSCOPY WITH PROPOFOL  Patient Location: PACU  Anesthesia Type:General  Level of Consciousness: awake, alert  and oriented  Airway & Oxygen Therapy: Patient Spontanous Breathing and Patient connected to nasal cannula oxygen  Post-op Assessment: Report given to RN and Post -op Vital signs reviewed and stable  Post vital signs: Reviewed and stable  Last Vitals:  Vitals Value Taken Time  BP 118/71 11/06/20 1000  Temp    Pulse 94 11/06/20 1002  Resp 20 11/06/20 1002  SpO2 99 % 11/06/20 1002  Vitals shown include unvalidated device data.  Last Pain:  Vitals:   11/06/20 0902  TempSrc: Temporal  PainSc: 0-No pain         Complications: No notable events documented.

## 2020-11-07 ENCOUNTER — Encounter: Payer: Self-pay | Admitting: General Surgery

## 2020-11-07 LAB — SURGICAL PATHOLOGY

## 2020-11-08 NOTE — Anesthesia Postprocedure Evaluation (Signed)
Anesthesia Post Note  Patient: Autumn Salazar  Procedure(s) Performed: COLONOSCOPY WITH PROPOFOL  Patient location during evaluation: Endoscopy Anesthesia Type: General Level of consciousness: awake and alert Pain management: pain level controlled Vital Signs Assessment: post-procedure vital signs reviewed and stable Respiratory status: spontaneous breathing, nonlabored ventilation, respiratory function stable and patient connected to nasal cannula oxygen Cardiovascular status: blood pressure returned to baseline and stable Postop Assessment: no apparent nausea or vomiting Anesthetic complications: no   No notable events documented.   Last Vitals:  Vitals:   11/06/20 1000 11/06/20 1020  BP:  132/85  Pulse:    Resp:    Temp: (!) 36.1 C   SpO2:      Last Pain:  Vitals:   11/06/20 1020  TempSrc:   PainSc: 0-No pain                 Martha Clan

## 2021-06-11 ENCOUNTER — Other Ambulatory Visit: Payer: Self-pay | Admitting: Internal Medicine

## 2021-06-11 DIAGNOSIS — Z1231 Encounter for screening mammogram for malignant neoplasm of breast: Secondary | ICD-10-CM

## 2021-07-04 ENCOUNTER — Ambulatory Visit
Admission: RE | Admit: 2021-07-04 | Discharge: 2021-07-04 | Disposition: A | Discharge status: Home / Self Care | Payer: BC Managed Care – PPO | Source: Ambulatory Visit | Attending: Internal Medicine | Admitting: Internal Medicine

## 2021-07-04 DIAGNOSIS — Z1231 Encounter for screening mammogram for malignant neoplasm of breast: Secondary | ICD-10-CM | POA: Insufficient documentation

## 2021-09-29 NOTE — Progress Notes (Unsigned)
Cardiology Office Note:    Date:  09/30/2021   ID:  Autumn Salazar, DOB June 30, 1955, MRN [id number]  PCP:  Autumn Salazar  Cardiologist:  None   Referring Salazar: Autumn Salazar   Chief Complaint  Patient presents with   Shortness of Breath    History of Present Illness:    Autumn Salazar is a 66 y.o. female with a hx of hypertension and SOB, who is referred by Autumn Knudsen C. [facility] internal medicine practice.  Chronic medical problems include prior breast cancer, prior Hodgkin's lymphoma, hypertension, mitral valve prolapse and hyperlipidemia.  Saw Dr. Ida Salazar 2014 for shortness of breath.  At that time she has stopped taking her beta-blocker and was self adjusting her blood pressure medications.  A pharmacologic nuclear study done at that time did not reveal ischemia and EF was reported at 51%.  She does have a family history of CAD that involved her father (MI) and mother (heart failure).  She expresses recent dyspnea on exertion and sporadic shooting discomfort in her back.  The dyspnea is with exertion.  There is no orthopnea.  After a trip to the beach 2 weeks ago she noticed some lower extremity swelling which is since resolved.  The back discomfort just occurred this past weekend and no particular explanation was noted.  She has had similar but not quite as severe discomfort over the years in the past.  Physical activity did not precipitate or aggravate the discomfort.  There was no associated shortness of breath at rest.  Her father had a heart attack at a young age.  Her mother had heart failure.  She is sensitive concerning the potential development of these problems as she gets older.  She never smoked, is not diabetic, but does have high blood pressure and has chronically had an elevated heart rate.  Past Medical History:  Diagnosis Date   Breast cancer (Autumn Salazar) 2007   Cancer Autumn Salazar) 2008   right lumpectomy with sentinel node biopsy and radiation therapy   Esophageal reflux     History of breast cancer 2007?   History of Hodgkin's lymphoma    HLD (hyperlipidemia)    HTN (hypertension) 2000   Personal history of radiation therapy 2008   RIGHT lumpectomy    Past Surgical History:  Procedure Laterality Date   ABDOMINAL HYSTERECTOMY  2000   biospy     of neck   BREAST BIOPSY Left 2007   breast cancer and rad   BREAST LUMPECTOMY Right 2007   right lumpectomy with sentinel node biopsy and radiation therapy   COLONOSCOPY  2012   Dr. Jamal Salazar   COLONOSCOPY WITH PROPOFOL N/A 11/06/2020   Procedure: COLONOSCOPY WITH PROPOFOL;  Surgeon: Autumn Salazar;  Location: [facility] ENDOSCOPY;  Service: Endoscopy;  Laterality: N/A;   FINE NEEDLE ASPIRATION Bilateral 06-12-14   thyroid nodules/BENIGN FOLLICULAR NODULE   LYMPH NODE BIOPSY  1979   mammosite balloon placement   2008   OTHER SURGICAL HISTORY     UPPER GI ENDOSCOPY  2012   Dr. Vira Salazar    Current Medications: Current Meds  Medication Sig   amLODipine (NORVASC) 2.5 MG tablet Take 2.5 mg by mouth daily.   aspirin 81 MG tablet Take 81 mg by mouth daily.   cetirizine (ZYRTEC) 5 MG tablet Take 5 mg by mouth daily.   hydrochlorothiazide (HYDRODIURIL) 25 MG tablet Take 25 mg by mouth daily.   losartan (COZAAR) 50 MG tablet Take 50 mg by  mouth daily.   meloxicam (MOBIC) 15 MG tablet Take 1 tablet (15 mg total) by mouth daily.   methocarbamol (ROBAXIN) 500 MG tablet Take 500 mg by mouth as needed.   omeprazole (PRILOSEC) 40 MG capsule Take 40 mg by mouth daily.   [DISCONTINUED] omeprazole (PRILOSEC) 20 MG capsule Take 20 mg by mouth daily.     Allergies:   Patient has no known allergies.   Social History   Socioeconomic History   Marital status: Married    Spouse name: Not on file   Number of children: Not on file   Years of education: Not on file   Highest education level: Not on file  Occupational History   Not on file  Tobacco Use   Smoking status: Never    Passive exposure: Past   Smokeless  tobacco: Never  Vaping Use   Vaping Use: Never used  Substance and Sexual Activity   Alcohol use: Yes    Comment: wine socially   Drug use: No   Sexual activity: Not on file  Other Topics Concern   Not on file  Social History Narrative   Not on file   Social Determinants of Health   Financial Resource Strain: Not on file  Food Insecurity: Not on file  Transportation Needs: Not on file  Physical Activity: Not on file  Stress: Not on file  Social Connections: Not on file     Family History: The patient's family history includes Diabetes in her mother; Heart attack (age of onset: 8) in her father; Heart disease in an other family member; Heart failure in her mother. There is no history of Breast cancer.  ROS:   Please see the history of present illness.    Anterior cervical fusion and fixation in December 2022.  She has occasional wheezing and coughing.  She had COVID-23 August 2021.  She has a longstanding history of hypertension for which she takes amlodipine, HCTZ, and losartan HCT.  All other systems reviewed and are negative.  EKGs/Labs/Other Studies Reviewed:    The following studies were reviewed today: Prior nuclear perfusion study report was reviewed.  No perfusion abnormality but EF 51% in 2014.  EKG:  EKG sinus tachycardia, 114 bpm, nonspecific ST abnormality, otherwise normal.  When compared to prior tracing from March 2014, the heart rate is slightly faster on today's EKG.  Then the heart rate was 102 bpm.  Recent Labs: No results found for requested labs within last 365 days.  Recent Lipid Panel    Component Value Date/Time   CHOL 234 (H) 02/24/2011 0618   TRIG 105 02/24/2011 0618   HDL 84 (H) 02/24/2011 0618   VLDL 21 02/24/2011 0618   LDLCALC 129 (H) 02/24/2011 0618    Physical Exam:    VS:  BP (!) 140/70   Pulse (!) 114   Ht '5\' 3"'$  (1.6 m)   Wt 152 lb (68.9 kg)   SpO2 96%   BMI 26.93 kg/m     Wt Readings from Last 3 Encounters:  09/30/21 152  lb (68.9 kg)  11/06/20 156 lb 6.7 oz (70.9 kg)  03/27/15 169 lb (76.7 kg)     GEN: Overweight with BMI 27. No acute distress HEENT: Normal NECK: No JVD. LYMPHATICS: No lymphadenopathy CARDIAC: No murmur. RRR no gallop, or edema. VASCULAR:  Normal Pulses. No bruits. RESPIRATORY:  Clear to auscultation without rales, wheezing or rhonchi  ABDOMEN: Soft, non-tender, non-distended, No pulsatile mass, MUSCULOSKELETAL: No deformity  SKIN: Warm and  dry NEUROLOGIC:  Alert and oriented x 3 PSYCHIATRIC:  Normal affect   ASSESSMENT:    1. Shortness of breath   2. Primary hypertension   3. Hyperlipidemia, unspecified hyperlipidemia type    PLAN:    In order of problems listed above:  Etiology is uncertain.  Increased heart rate at rest is a concern.  Rule out systolic dysfunction.  Rule out diastolic dysfunction.  Rule out pulmonary hypertension.  Check TSH, CBC, and brain natruretic peptide.  2D Doppler echocardiogram. Blood pressure today on medications is adequately controlled. Has elevated lipids but no idea of what the number ranges are.  She will send [place] copies of her recent executive exam laboratory data.  Cardiac evaluation will be done to determine if there is a functional basis for exertional dyspnea.  She has not been as active as she was prior to her cervical fusion 8 months ago and perhaps she is deconditioned.  I am concerned about the resting tachycardia and will repeat thyroid study, hemoglobin, and consider catecholamine excess.   Medication Adjustments/Labs and Tests Ordered: Current medicines are reviewed at length with the patient today.  Concerns regarding medicines are outlined above.  Orders Placed This Encounter  Procedures   Basic metabolic panel   TSH   ECHOCARDIOGRAM COMPLETE   No orders of the defined types were placed in this encounter.   Patient Instructions  Medication Instructions:  Your physician recommends that you continue on your current  medications as directed. Please refer to the Current Medication list given to you today.  *If you need a refill on your cardiac medications before your next appointment, please call your pharmacy*   Lab Work: Bmet, TSH If you have labs (blood work) drawn today and your tests are completely normal, you will receive your results only by: [place] (if you have MyChart) OR A paper copy in the mail If you have any lab test that is abnormal or we need to change your treatment, we will call you to review the results.   Testing/Procedures: ECHO Your physician has requested that you have an echocardiogram. Echocardiography is a painless test that uses sound waves to create images of your heart. It provides your doctor with information about the size and shape of your heart and how well your heart's chambers and valves are working. This procedure takes approximately one hour. There are no restrictions for this procedure.    Follow-Up: At [facility], you and your health needs are our priority.  As part of our continuing mission to provide you with exceptional heart care, we have created designated Provider Care Teams.  These Care Teams include your primary Cardiologist (physician) and Advanced Practice Providers (APPs -  Physician Assistants and Nurse Practitioners) who all work together to provide you with the care you need, when you need it.  Your next appointment:   As needed  The format for your next appointment:   In Person  Provider:   Dr. Tamala Julian  Other Instructions Please send Dr Tamala Julian a copy of your old lab work         Signed, Sinclair Grooms, Salazar  09/30/2021 2:48 PM    [place]

## 2021-09-30 ENCOUNTER — Ambulatory Visit: Payer: BC Managed Care – PPO | Attending: Interventional Cardiology | Admitting: Interventional Cardiology

## 2021-09-30 ENCOUNTER — Encounter: Payer: Self-pay | Admitting: Interventional Cardiology

## 2021-09-30 VITALS — BP 140/70 | HR 114 | Ht 63.0 in | Wt 152.0 lb

## 2021-09-30 DIAGNOSIS — R0602 Shortness of breath: Secondary | ICD-10-CM | POA: Diagnosis not present

## 2021-09-30 DIAGNOSIS — I10 Essential (primary) hypertension: Secondary | ICD-10-CM

## 2021-09-30 DIAGNOSIS — E785 Hyperlipidemia, unspecified: Secondary | ICD-10-CM | POA: Diagnosis not present

## 2021-09-30 NOTE — Patient Instructions (Signed)
Medication Instructions:  Your physician recommends that you continue on your current medications as directed. Please refer to the Current Medication list given to you today.  *If you need a refill on your cardiac medications before your next appointment, please call your pharmacy*   Lab Work: Bmet, TSH If you have labs (blood work) drawn today and your tests are completely normal, you will receive your results only by: [place] (if you have MyChart) OR A paper copy in the mail If you have any lab test that is abnormal or we need to change your treatment, we will call you to review the results.   Testing/Procedures: ECHO Your physician has requested that you have an echocardiogram. Echocardiography is a painless test that uses sound waves to create images of your heart. It provides your doctor with information about the size and shape of your heart and how well your heart's chambers and valves are working. This procedure takes approximately one hour. There are no restrictions for this procedure.    Follow-Up: At [facility], you and your health needs are our priority.  As part of our continuing mission to provide you with exceptional heart care, we have created designated Provider Care Teams.  These Care Teams include your primary Cardiologist (physician) and Advanced Practice Providers (APPs -  Physician Assistants and Nurse Practitioners) who all work together to provide you with the care you need, when you need it.  Your next appointment:   As needed  The format for your next appointment:   In Person  Provider:   Dr. Tamala Julian  Other Instructions Please send Dr Tamala Julian a copy of your old lab work

## 2021-10-01 LAB — BASIC METABOLIC PANEL
BUN/Creatinine Ratio: 19 (ref 12–28)
BUN: 13 mg/dL (ref 8–27)
CO2: 24 mmol/L (ref 20–29)
Calcium: 10.6 mg/dL — ABNORMAL HIGH (ref 8.7–10.3)
Chloride: 99 mmol/L (ref 96–106)
Creatinine, Ser: 0.67 mg/dL (ref 0.57–1.00)
Glucose: 129 mg/dL — ABNORMAL HIGH (ref 70–99)
Potassium: 3.6 mmol/L (ref 3.5–5.2)
Sodium: 142 mmol/L (ref 134–144)
eGFR: 97 mL/min/{1.73_m2} (ref >59)

## 2021-10-01 LAB — TSH: TSH: 1.93 u[IU]/mL (ref 0.450–4.500)

## 2021-10-01 NOTE — Addendum Note (Signed)
Addended by: Janan Halter F on: 10/01/2021 05:41 PM   Modules accepted: Orders

## 2021-10-03 NOTE — Addendum Note (Signed)
Addended byDanielle Dess on: 10/03/2021 12:28 PM   Modules accepted: Orders

## 2021-10-07 ENCOUNTER — Ambulatory Visit (HOSPITAL_COMMUNITY): Payer: BC Managed Care – PPO | Attending: Interventional Cardiology

## 2021-10-07 DIAGNOSIS — E785 Hyperlipidemia, unspecified: Secondary | ICD-10-CM | POA: Insufficient documentation

## 2021-10-07 DIAGNOSIS — I10 Essential (primary) hypertension: Secondary | ICD-10-CM | POA: Diagnosis not present

## 2021-10-07 DIAGNOSIS — R0602 Shortness of breath: Secondary | ICD-10-CM | POA: Diagnosis not present

## 2021-10-07 LAB — ECHOCARDIOGRAM COMPLETE
P 1/2 time: 189 msec
S' Lateral: 3.1 cm

## 2021-10-07 MED ORDER — PERFLUTREN LIPID MICROSPHERE
1.0000 mL | INTRAVENOUS | Status: AC | PRN
  Administered 2021-10-07: 2 mL via INTRAVENOUS

## 2021-10-08 ENCOUNTER — Other Ambulatory Visit: Payer: Self-pay | Admitting: Family Medicine

## 2021-10-09 LAB — CMP12+LP+TP+TSH+6AC+CBC/D/PLT
ALT: 12 IU/L (ref 0–32)
AST: 16 IU/L (ref 0–40)
Albumin/Globulin Ratio: 1.7 (ref 1.2–2.2)
Albumin: 4.4 g/dL (ref 3.9–4.9)
Alkaline Phosphatase: 79 IU/L (ref 44–121)
BUN/Creatinine Ratio: 19 (ref 12–28)
BUN: 12 mg/dL (ref 8–27)
Basophils Absolute: 0.1 10*3/uL (ref 0.0–0.2)
Basos: 1 %
Bilirubin Total: 0.5 mg/dL (ref 0.0–1.2)
Calcium: 10 mg/dL (ref 8.7–10.3)
Chloride: 100 mmol/L (ref 96–106)
Chol/HDL Ratio: 2.6 ratio (ref 0.0–4.4)
Cholesterol, Total: 230 mg/dL — ABNORMAL HIGH (ref 100–199)
Creatinine, Ser: 0.63 mg/dL (ref 0.57–1.00)
EOS (ABSOLUTE): 0.3 10*3/uL (ref 0.0–0.4)
Eos: 3 %
Estimated CHD Risk: <0.5 times avg. (ref 0.0–1.0)
Free Thyroxine Index: 1.4 (ref 1.2–4.9)
GGT: 27 IU/L (ref 0–60)
Globulin, Total: 2.6 g/dL (ref 1.5–4.5)
Glucose: 94 mg/dL (ref 70–99)
HDL: 89 mg/dL (ref >39)
Hematocrit: 43.2 % (ref 34.0–46.6)
Hemoglobin: 14.5 g/dL (ref 11.1–15.9)
Immature Grans (Abs): 0 10*3/uL (ref 0.0–0.1)
Immature Granulocytes: 0 %
Iron: 107 ug/dL (ref 27–139)
LDH: 161 IU/L (ref 119–226)
LDL Chol Calc (NIH): 132 mg/dL — ABNORMAL HIGH (ref 0–99)
Lymphocytes Absolute: 2.8 10*3/uL (ref 0.7–3.1)
Lymphs: 35 %
MCH: 31.5 pg (ref 26.6–33.0)
MCHC: 33.6 g/dL (ref 31.5–35.7)
MCV: 94 fL (ref 79–97)
Monocytes Absolute: 0.7 10*3/uL (ref 0.1–0.9)
Monocytes: 9 %
Neutrophils Absolute: 4.2 10*3/uL (ref 1.4–7.0)
Neutrophils: 52 %
Phosphorus: 3.2 mg/dL (ref 3.0–4.3)
Potassium: 3.7 mmol/L (ref 3.5–5.2)
RBC: 4.6 x10E6/uL (ref 3.77–5.28)
RDW: 13 % (ref 11.7–15.4)
Sodium: 141 mmol/L (ref 134–144)
T3 Uptake Ratio: 24 % (ref 24–39)
T4, Total: 5.9 ug/dL (ref 4.5–12.0)
TSH: 0.998 u[IU]/mL (ref 0.450–4.500)
Total Protein: 7 g/dL (ref 6.0–8.5)
Triglycerides: 55 mg/dL (ref 0–149)
Uric Acid: 4.5 mg/dL (ref 3.0–7.2)
VLDL Cholesterol Cal: 9 mg/dL (ref 5–40)
WBC: 8 10*3/uL (ref 3.4–10.8)
eGFR: 98 mL/min/{1.73_m2} (ref >59)

## 2021-10-10 ENCOUNTER — Telehealth: Payer: Self-pay

## 2021-10-10 NOTE — Telephone Encounter (Signed)
-----   Message from Belva Crome, MD sent at 10/08/2021  9:41 PM EDT ----- Let the patient know based on moderate MS and AR by Echo with normal heart strength, we should start Metoprolol succinate 25 mg daily. We will assess results of therapy at OV. A copy will be sent to Stacie Glaze, DO

## 2021-10-10 NOTE — Telephone Encounter (Signed)
Spoke with patient and discussed echo results.  Per Dr. Tamala Julian: Let the patient know based on moderate MS and AR by Echo with normal heart strength, we should start Metoprolol succinate 25 mg daily. We will assess results of therapy at OV.  Recommend follow-up OV 1 month after starting metoprolol.  Patient states she was diagnosed with this "years ago" and in her 47s tried taking metoprolol but it made her too sleepy to perform her job. She states she works around [employer], [employer]. Patient is concerned about taking this medication and asked if she can try taking 12.'5mg'$  of metoprolol succinate QD instead.   Will forward to Dr. Tamala Julian to review and advise on patient taking metoprolol succinate 12.'5mg'$  QD instead of '25mg'$  QD.

## 2021-10-15 MED ORDER — METOPROLOL SUCCINATE ER 25 MG PO TB24: 12.5000 mg | ORAL_TABLET | Freq: Every day | ORAL | 3 refills | Status: DC

## 2021-10-15 NOTE — Telephone Encounter (Signed)
Spoke with patient, per Dr. Tamala Julian okay to try metoprolol succinate 12.'5mg'$  QD with OV F/U in 1 month on 11/17/21 with Ambrose Pancoast, NP.

## 2021-10-15 NOTE — Addendum Note (Signed)
Addended by: Molli Barrows on: 10/15/2021 08:24 AM   Modules accepted: Orders

## 2021-11-17 ENCOUNTER — Ambulatory Visit: Payer: BC Managed Care – PPO | Admitting: Nurse Practitioner

## 2021-12-09 ENCOUNTER — Ambulatory Visit
Admission: RE | Admit: 2021-12-09 | Discharge: 2021-12-09 | Disposition: A | Discharge status: Home / Self Care | Payer: BC Managed Care – PPO | Source: Ambulatory Visit | Attending: Internal Medicine | Admitting: Internal Medicine

## 2021-12-09 ENCOUNTER — Other Ambulatory Visit: Payer: Self-pay | Admitting: Internal Medicine

## 2021-12-09 DIAGNOSIS — R0602 Shortness of breath: Secondary | ICD-10-CM

## 2021-12-09 DIAGNOSIS — R062 Wheezing: Secondary | ICD-10-CM | POA: Insufficient documentation

## 2022-05-22 ENCOUNTER — Other Ambulatory Visit: Payer: Self-pay | Admitting: Internal Medicine

## 2022-05-22 DIAGNOSIS — Z1231 Encounter for screening mammogram for malignant neoplasm of breast: Secondary | ICD-10-CM

## 2022-07-17 ENCOUNTER — Ambulatory Visit
Admission: RE | Admit: 2022-07-17 | Discharge: 2022-07-17 | Disposition: A | Discharge status: Home / Self Care | Payer: BC Managed Care – PPO | Source: Ambulatory Visit | Attending: Internal Medicine | Admitting: Internal Medicine

## 2022-07-17 DIAGNOSIS — Z1231 Encounter for screening mammogram for malignant neoplasm of breast: Secondary | ICD-10-CM | POA: Diagnosis present

## 2022-12-26 ENCOUNTER — Other Ambulatory Visit: Payer: Self-pay

## 2022-12-26 ENCOUNTER — Emergency Department
Admission: EM | Admit: 2022-12-26 | Discharge: 2022-12-26 | Disposition: A | Discharge status: Home / Self Care | Payer: BC Managed Care – PPO | Attending: Student in an Organized Health Care Education/Training Program | Admitting: Student in an Organized Health Care Education/Training Program

## 2022-12-26 ENCOUNTER — Emergency Department: Payer: BC Managed Care – PPO

## 2022-12-26 DIAGNOSIS — R410 Disorientation, unspecified: Secondary | ICD-10-CM | POA: Diagnosis present

## 2022-12-26 DIAGNOSIS — I10 Essential (primary) hypertension: Secondary | ICD-10-CM | POA: Insufficient documentation

## 2022-12-26 LAB — CBC
HCT: 39 % (ref 36.0–46.0)
Hemoglobin: 13.7 g/dL (ref 12.0–15.0)
MCH: 32.9 pg (ref 26.0–34.0)
MCHC: 35.1 g/dL (ref 30.0–36.0)
MCV: 93.5 fL (ref 80.0–100.0)
Platelets: UNDETERMINED 10*3/uL (ref 150–400)
RBC: 4.17 MIL/uL (ref 3.87–5.11)
RDW: 13.7 % (ref 11.5–15.5)
WBC: 12 10*3/uL — ABNORMAL HIGH (ref 4.0–10.5)
nRBC: 0 % (ref 0.0–0.2)

## 2022-12-26 LAB — COMPREHENSIVE METABOLIC PANEL
ALT: 24 U/L (ref 0–44)
AST: 27 U/L (ref 15–41)
Albumin: 4.3 g/dL (ref 3.5–5.0)
Alkaline Phosphatase: 74 U/L (ref 38–126)
Anion gap: 9 (ref 5–15)
BUN: 17 mg/dL (ref 8–23)
CO2: 26 mmol/L (ref 22–32)
Calcium: 9.9 mg/dL (ref 8.9–10.3)
Chloride: 102 mmol/L (ref 98–111)
Creatinine, Ser: 0.5 mg/dL (ref 0.44–1.00)
GFR, Estimated: >60 mL/min (ref >60)
Glucose, Bld: 122 mg/dL — ABNORMAL HIGH (ref 70–99)
Potassium: 3 mmol/L — ABNORMAL LOW (ref 3.5–5.1)
Sodium: 137 mmol/L (ref 135–145)
Total Bilirubin: 0.7 mg/dL (ref <1.2)
Total Protein: 7.6 g/dL (ref 6.5–8.1)

## 2022-12-26 LAB — URINALYSIS, ROUTINE W REFLEX MICROSCOPIC
Bacteria, UA: NONE SEEN
Bilirubin Urine: NEGATIVE
Glucose, UA: NEGATIVE mg/dL
Ketones, ur: NEGATIVE mg/dL
Nitrite: NEGATIVE
Protein, ur: NEGATIVE mg/dL
Specific Gravity, Urine: 1.008 (ref 1.005–1.030)
pH: 7 (ref 5.0–8.0)

## 2022-12-26 LAB — CBG MONITORING, ED: Glucose-Capillary: 105 mg/dL — ABNORMAL HIGH (ref 70–99)

## 2022-12-26 MED ORDER — POTASSIUM CHLORIDE CRYS ER 20 MEQ PO TBCR
40.0000 meq | EXTENDED_RELEASE_TABLET | Freq: Once | ORAL | Status: AC
Start: 2022-12-26 — End: 2022-12-26
  Administered 2022-12-26: 40 meq via ORAL
  Filled 2022-12-26: qty 2

## 2022-12-26 NOTE — ED Notes (Signed)
Pt off the floor to imaging.

## 2022-12-26 NOTE — ED Triage Notes (Addendum)
Pt presents for transient period of confusion at 1pm (LKW 12pm). When patient came in from blowing leaves was confused and states she felt disoriented. Voice was clear at that time and patient had a steady gait.  In triage, patient is A&Ox4, states that she does not remember the event that her husband describes.  Denies pain, SOB  Not on a blood thinner.   Pt had a similar event 5 years, unable to recall specific cause, describes what sounds like a hypertensive crisis In triage, NIH 0.

## 2022-12-26 NOTE — ED Notes (Addendum)
Pt transported to CT ?

## 2022-12-26 NOTE — ED Provider Notes (Signed)
[facility] Provider Note    Event Date/Time   First MD Initiated Contact with Patient 12/26/22 1526     (approximate)   History   Altered Mental Status   HPI  Autumn MCCLAUGHERTY is a 67 y.o. female with a history of hypertension presents to the ER for evaluation of episode of confusion that occurred while she was blowing leaves in her backyard around lunchtime.  She came in and has noted to be confused by her husband.  No facial droop or weakness.  States that she could feel that she was confused just felt disoriented.  The symptoms lasted less than an hour.  She did go eat something but did not feel any significant change after eating.  Went to lay down.  On the way to the hospital she started feeling better.  Denies any symptoms at this time.  No confusion.     Physical Exam   Triage Vital Signs: ED Triage Vitals [12/26/22 1506]  Encounter Vitals Group     BP (!) 185/105     Systolic BP Percentile      Diastolic BP Percentile      Pulse Rate (!) 109     Resp 20     Temp 98 F (36.7 C)     Temp src      SpO2 99 %     Weight      Height      Head Circumference      Peak Flow      Pain Score      Pain Loc      Pain Education      Exclude from Growth Chart     Most recent vital signs: Vitals:   12/26/22 1506 12/26/22 1546  BP: (!) 185/105 (!) 154/77  Pulse: (!) 109 (!) 101  Resp: 20 14  Temp: 98 F (36.7 C) 98 F (36.7 C)  SpO2: 99% 99%     Constitutional: Alert  Eyes: Conjunctivae are normal.  Head: Atraumatic. Nose: No congestion/rhinnorhea. Mouth/Throat: Mucous membranes are moist.   Neck: Painless ROM.  Cardiovascular:   Good peripheral circulation. Respiratory: Normal respiratory effort.  No retractions.  Gastrointestinal: Soft and nontender.  Musculoskeletal:  no deformity Neurologic:  MAE spontaneously. No gross focal neurologic deficits are appreciated.  Skin:  Skin is warm, dry and intact. No rash noted. Psychiatric:  Mood and affect are normal. Speech and behavior are normal.    ED Results / Procedures / Treatments   Labs (all labs ordered are listed, but only abnormal results are displayed) Labs Reviewed  COMPREHENSIVE METABOLIC PANEL - Abnormal; Notable for the following components:      Result Value   Potassium 3.0 (*)    Glucose, Bld 122 (*)    All other components within normal limits  CBC - Abnormal; Notable for the following components:   WBC 12.0 (*)    All other components within normal limits  URINALYSIS, ROUTINE W REFLEX MICROSCOPIC - Abnormal; Notable for the following components:   Color, Urine STRAW (*)    APPearance CLEAR (*)    Hgb urine dipstick SMALL (*)    Leukocytes,Ua SMALL (*)    All other components within normal limits  CBG MONITORING, ED - Abnormal; Notable for the following components:   Glucose-Capillary 105 (*)    All other components within normal limits     EKG  ED ECG REPORT I, Willy Eddy, the attending physician, personally viewed and interpreted  this ECG.   Date: 12/26/2022  EKG Time: 18:49  Rate: 95  Rhythm: sinus  Axis: normal  Intervals: normal  ST&T Change:  no stemi, no depressions    RADIOLOGY Please see ED Course for my review and interpretation.  I personally reviewed all radiographic images ordered to evaluate for the above acute complaints and reviewed radiology reports and findings.  These findings were personally discussed with the patient.  Please see medical record for radiology report.    PROCEDURES:  Critical Care performed: No  Procedures   MEDICATIONS ORDERED IN ED: Medications  potassium chloride SA (KLOR-CON M) CR tablet 40 mEq (has no administration in time range)     IMPRESSION / MDM / ASSESSMENT AND PLAN / ED COURSE  I reviewed the triage vital signs and the nursing notes.                              Differential diagnosis includes, but is not limited to, cva, tia, hypoglycemia, dehydration,  electrolyte abnormality, dissection, sepsis  Patient presenting to the ER for evaluation of symptoms as described above.  Based on symptoms, risk factors and considered above differential, this presenting complaint could reflect a potentially life-threatening illness therefore the patient will be placed on continuous pulse oximetry and telemetry for monitoring.  Laboratory evaluation will be sent to evaluate for the above complaints.      Clinical Course as of 12/26/22 1859  Sat Dec 26, 2022  1851 MRI is grossly unremarkable with some minimal small vessel changes.  UA without evidence of infection. [PR]  1855 Discussed findings with patient consideration of TIA hypertensive urgency and differential.  She remains well-appearing right now she is requesting discharge home.  Discussed my recommendation for admission to hospital for workup but she would prefer outpatient follow-up.  Does have capacity make these decisions and understands the diagnoses that we are considering and the risk associated with these.  She does have well-established outpatient care.  While TIA considered she also had less to eat prior to working out in the yard it is possible that her blood sugar had dropped and improved after she had a [employer].  Will be given referral to neurology as well.  We discussed strict return precautions. [PR]    Clinical Course User Index [PR] Willy Eddy, MD     FINAL CLINICAL IMPRESSION(S) / ED DIAGNOSES   Final diagnoses:  Confusion  Hypertension, unspecified type     Rx / DC Orders   ED Discharge Orders     None        Note:  This document was prepared using Dragon voice recognition software and may include unintentional dictation errors.    Willy Eddy, MD 12/26/22 [phone]

## 2023-05-12 ENCOUNTER — Other Ambulatory Visit: Payer: Self-pay

## 2023-05-14 ENCOUNTER — Other Ambulatory Visit: Payer: Self-pay

## 2023-05-14 MED ORDER — HYDROCHLOROTHIAZIDE 25 MG PO TABS
25.0000 mg | ORAL_TABLET | Freq: Every day | ORAL | 4 refills | Status: DC
  Filled 2023-07-19: qty 90, 90d supply, fill #0

## 2023-05-14 MED ORDER — LOSARTAN POTASSIUM 50 MG PO TABS
25.0000 mg | ORAL_TABLET | Freq: Every day | ORAL | 4 refills | Status: DC
Start: 2023-05-14 — End: 2023-08-04

## 2023-05-14 MED ORDER — AMLODIPINE BESYLATE 2.5 MG PO TABS
2.5000 mg | ORAL_TABLET | Freq: Every day | ORAL | 4 refills | Status: DC
  Filled 2023-07-19: qty 90, 90d supply, fill #0

## 2023-05-14 MED ORDER — MELOXICAM 15 MG PO TABS: 15.0000 mg | ORAL_TABLET | Freq: Every day | ORAL | 2 refills | Status: DC

## 2023-06-23 ENCOUNTER — Other Ambulatory Visit: Payer: Self-pay | Admitting: Internal Medicine

## 2023-06-23 DIAGNOSIS — Z1231 Encounter for screening mammogram for malignant neoplasm of breast: Secondary | ICD-10-CM

## 2023-07-19 ENCOUNTER — Other Ambulatory Visit: Payer: Self-pay

## 2023-07-19 ENCOUNTER — Other Ambulatory Visit: Payer: Self-pay | Admitting: Family Medicine

## 2023-07-19 ENCOUNTER — Ambulatory Visit
Admission: RE | Admit: 2023-07-19 | Discharge: 2023-07-19 | Disposition: A | Discharge status: Home / Self Care | Source: Ambulatory Visit | Attending: Internal Medicine | Admitting: Internal Medicine

## 2023-07-19 DIAGNOSIS — Z1231 Encounter for screening mammogram for malignant neoplasm of breast: Secondary | ICD-10-CM | POA: Diagnosis present

## 2023-07-20 ENCOUNTER — Telehealth: Payer: Self-pay

## 2023-07-20 ENCOUNTER — Other Ambulatory Visit: Payer: Self-pay

## 2023-07-20 NOTE — Telephone Encounter (Signed)
 Copied from CRM [id number]. Topic: Clinical - Medication Question >> Jul 19, 2023  4:05 PM Autumn Salazar wrote: Reason for CRM: Patient is calling in regarding refill for metoprolol  succinate (TOPROL  XL) 25 MG 24 hr tablet, patient has an appointment scheduled Jul 30 & wanting to know if she can get a bridge to get her to appointment. Advised cannot guarantee as she has not established care with Dr. Lauraine Buoy yet. Please advise # [phone]

## 2023-07-20 NOTE — Telephone Encounter (Signed)
 Patient has an upcoming appt 08/04/23 with you as a new pt. Pt is requesting a refill on her metoprolol . Please advise.

## 2023-07-20 NOTE — Telephone Encounter (Signed)
 Spoke with pt and advised recommendation. Pt stated she called pharmacist and advised her she could get metoprolol  filled if pt paid full price, pt went ahead and paid full price and has enough til the end of August. Pt verbalized understanding.

## 2023-07-22 ENCOUNTER — Ambulatory Visit: Payer: Self-pay | Admitting: Family Medicine

## 2023-08-04 ENCOUNTER — Other Ambulatory Visit: Payer: Self-pay

## 2023-08-04 ENCOUNTER — Encounter: Payer: Self-pay | Admitting: Family Medicine

## 2023-08-04 ENCOUNTER — Ambulatory Visit: Admitting: Family Medicine

## 2023-08-04 VITALS — BP 132/64 | HR 96 | Ht 62.5 in | Wt 150.3 lb

## 2023-08-04 DIAGNOSIS — Z23 Encounter for immunization: Secondary | ICD-10-CM | POA: Diagnosis not present

## 2023-08-04 DIAGNOSIS — E782 Mixed hyperlipidemia: Secondary | ICD-10-CM

## 2023-08-04 DIAGNOSIS — I10 Essential (primary) hypertension: Secondary | ICD-10-CM | POA: Diagnosis not present

## 2023-08-04 DIAGNOSIS — M542 Cervicalgia: Secondary | ICD-10-CM

## 2023-08-04 DIAGNOSIS — E041 Nontoxic single thyroid nodule: Secondary | ICD-10-CM

## 2023-08-04 DIAGNOSIS — Z780 Asymptomatic menopausal state: Secondary | ICD-10-CM

## 2023-08-04 DIAGNOSIS — K219 Gastro-esophageal reflux disease without esophagitis: Secondary | ICD-10-CM

## 2023-08-04 DIAGNOSIS — Z853 Personal history of malignant neoplasm of breast: Secondary | ICD-10-CM

## 2023-08-04 DIAGNOSIS — R0609 Other forms of dyspnea: Secondary | ICD-10-CM | POA: Diagnosis not present

## 2023-08-04 DIAGNOSIS — Z1159 Encounter for screening for other viral diseases: Secondary | ICD-10-CM

## 2023-08-04 DIAGNOSIS — R053 Chronic cough: Secondary | ICD-10-CM

## 2023-08-04 DIAGNOSIS — Z79899 Other long term (current) drug therapy: Secondary | ICD-10-CM

## 2023-08-04 DIAGNOSIS — Z13820 Encounter for screening for osteoporosis: Secondary | ICD-10-CM

## 2023-08-04 DIAGNOSIS — Z7689 Persons encountering health services in other specified circumstances: Secondary | ICD-10-CM

## 2023-08-04 DIAGNOSIS — G8928 Other chronic postprocedural pain: Secondary | ICD-10-CM

## 2023-08-04 MED ORDER — LOSARTAN POTASSIUM 50 MG PO TABS
25.0000 mg | ORAL_TABLET | Freq: Every day | ORAL | 3 refills | Status: AC
  Filled 2023-08-04: qty 45, 90d supply, fill #0
  Filled 2024-01-07: qty 45, 90d supply, fill #1

## 2023-08-04 MED ORDER — OMEPRAZOLE 20 MG PO CPDR
20.0000 mg | DELAYED_RELEASE_CAPSULE | Freq: Every day | ORAL | 3 refills | Status: DC
  Filled 2023-08-04 – 2024-01-17 (×2): qty 90, 90d supply, fill #0

## 2023-08-04 MED ORDER — OMEPRAZOLE 40 MG PO CPDR
40.0000 mg | DELAYED_RELEASE_CAPSULE | Freq: Every day | ORAL | 3 refills | Status: DC
  Filled 2023-08-04: qty 90, 90d supply, fill #0

## 2023-08-04 MED ORDER — MELOXICAM 15 MG PO TABS
15.0000 mg | ORAL_TABLET | Freq: Every day | ORAL | 2 refills | Status: DC | PRN
  Filled 2023-08-04 – 2024-01-17 (×2): qty 90, 90d supply, fill #0

## 2023-08-04 MED ORDER — METOPROLOL SUCCINATE ER 25 MG PO TB24
12.5000 mg | ORAL_TABLET | Freq: Every day | ORAL | 3 refills | Status: AC
  Filled 2023-08-04: qty 45, 90d supply, fill #0
  Filled 2024-01-17: qty 45, 90d supply, fill #1

## 2023-08-04 MED ORDER — HYDROCHLOROTHIAZIDE 25 MG PO TABS
25.0000 mg | ORAL_TABLET | Freq: Every day | ORAL | 3 refills | Status: AC
  Filled 2023-08-04 – 2023-10-12 (×2): qty 90, 90d supply, fill #0
  Filled 2024-01-17: qty 90, 90d supply, fill #1

## 2023-08-04 MED ORDER — AMLODIPINE BESYLATE 2.5 MG PO TABS
2.5000 mg | ORAL_TABLET | Freq: Every day | ORAL | 3 refills | Status: AC
  Filled 2023-08-04 – 2023-10-12 (×2): qty 90, 90d supply, fill #0
  Filled 2024-01-17: qty 90, 90d supply, fill #1

## 2023-08-04 NOTE — Progress Notes (Signed)
 New patient visit   Patient: Autumn Salazar   DOB: March 14, 1955   68 y.o. Female  MRN: [id number] Visit Date: 08/04/2023  Today's healthcare provider: LAURAINE LOISE BUOY, DO   Chief Complaint  Patient presents with   New Patient (Initial Visit)   Subjective    Autumn Salazar is a 68 y.o. female who presents today as a new patient to establish care.   HPI  [name] is a 68 year old female with a history of breast cancer and Hodgkin's disease who presents with a chronic cough. She is accompanied by her husband, Autumn Salazar.  She has experienced a dry, deep cough occurring once daily for the past six months, which has remained unchanged and is not associated with phlegm production. She recently retired from working as a Product manager in a [employer] with potential respiratory exposure and is concerned about this. She has been using Flonase nasal spray as needed and stopped Zyrtec a week ago without any change in her cough.  Her medical history includes breast cancer treated with lumpectomy and radiation, and Hodgkin's disease treated with radiation. She is no longer under active oncology follow-up. She takes metoprolol  succinate 25 mg (half tablet), losartan  25 mg, hydrochlorothiazide , and amlodipine  for hypertension. She uses omeprazole  as needed for gastroesophageal reflux disease and meloxicam  as needed for pain. She has a history of neck surgery with ongoing neck pain and difficulty holding her head up, for which she has used methocarbamol  and received cortisone injections in the past. She performs neck exercises and is trying to increase her physical activity.  She reports occasional shortness of breath, particularly with exertion, but does not use inhalers regularly. She has Proventil and Advair inhalers available but has not used them regularly. She experiences fatigue related to her neck issues but denies chronic fatigue or vitamin D deficiency.  She has a history of using earplugs  daily, leading to scaly ears and wax buildup, but denies any ear pain. She has not had recent blood work this year but has no known history of diabetes or thyroid  issues. She is open to screening for hepatitis C.     Past Medical History:  Diagnosis Date   Allergy    Arthritis    Breast cancer (HCC) 2007   Cancer [facility]) 2008   right lumpectomy with sentinel node biopsy and radiation therapy   Esophageal reflux    History of breast cancer 2007?   History of Hodgkin's lymphoma    HLD (hyperlipidemia)    HTN (hypertension) 2000   Personal history of radiation therapy 2008   RIGHT lumpectomy   Past Surgical History:  Procedure Laterality Date   ABDOMINAL HYSTERECTOMY  2000   biospy     of neck   BREAST BIOPSY Left 2007   breast cancer and rad   BREAST LUMPECTOMY Right 2007   right lumpectomy with sentinel node biopsy and radiation therapy   COLONOSCOPY  2012   Dr. Dellie   COLONOSCOPY WITH PROPOFOL  N/A 11/06/2020   Procedure: COLONOSCOPY WITH PROPOFOL ;  Surgeon: Dessa Reyes ORN, MD;  Location: [facility] ENDOSCOPY;  Service: Endoscopy;  Laterality: N/A;   FINE NEEDLE ASPIRATION Bilateral 06-12-14   thyroid  nodules/BENIGN FOLLICULAR NODULE   LYMPH NODE BIOPSY  1979   mammosite balloon placement   2008   OTHER SURGICAL HISTORY     UPPER GI ENDOSCOPY  2012   Dr. Viktoria   Family Status  Relation Name Status   Mother  Stoney Nicholaus Pesa Deceased at age 76       CHF   Father Lynwood Tod Pesa Deceased   MGM  (Not Specified)   Other  (Not Specified)   Neg Hx  (Not Specified)  No partnership data on file   Family History  Problem Relation Age of Onset   Diabetes Mother        family history   Heart failure Mother    Heart attack Father 35       MI   Aneurysm Maternal Grandmother    Heart disease Other        strong family history   Breast cancer Neg Hx    Social History   Socioeconomic History   Marital status: Married    Spouse name: Not on file   Number of  children: Not on file   Years of education: Not on file   Highest education level: Not on file  Occupational History   Not on file  Tobacco Use   Smoking status: Never    Passive exposure: Past   Smokeless tobacco: Never  Vaping Use   Vaping status: Never Used  Substance and Sexual Activity   Alcohol use: Yes    Alcohol/week: 14.0 standard drinks of alcohol    Types: 14 Glasses of wine per week    Comment: wine socially   Drug use: No   Sexual activity: Not on file  Other Topics Concern   Not on file  Social History Narrative   Not on file   Social Drivers of Health   Financial Resource Strain: Not on file  Food Insecurity: Not on file  Transportation Needs: Not on file  Physical Activity: Not on file  Stress: Not on file  Social Connections: Not on file   Outpatient Medications Prior to Visit  Medication Sig   aspirin 81 MG tablet Take 81 mg by mouth daily.   cetirizine (ZYRTEC) 5 MG tablet Take 5 mg by mouth daily.   fluticasone (FLONASE) 50 MCG/ACT nasal spray Place 1 spray into both nostrils daily as needed for allergies or rhinitis.   [DISCONTINUED] amLODipine  (NORVASC ) 2.5 MG tablet Take 1 tablet (2.5 mg total) by mouth daily.   [DISCONTINUED] hydrochlorothiazide  (HYDRODIURIL ) 25 MG tablet Take 1 tablet (25 mg total) by mouth daily.   [DISCONTINUED] losartan  (COZAAR ) 50 MG tablet Take 0.5 tablets (25 mg total) by mouth daily.   [DISCONTINUED] meloxicam  (MOBIC ) 15 MG tablet Take 1 tablet (15 mg total) by mouth daily.   [DISCONTINUED] metoprolol  succinate (TOPROL  XL) 25 MG 24 hr tablet Take 0.5 tablets (12.5 mg total) by mouth daily.   [DISCONTINUED] amLODipine  (NORVASC ) 2.5 MG tablet Take 2.5 mg by mouth daily. (Patient not taking: Reported on 08/04/2023)   [DISCONTINUED] hydrochlorothiazide  (HYDRODIURIL ) 25 MG tablet Take 25 mg by mouth daily. (Patient not taking: Reported on 08/04/2023)   [DISCONTINUED] losartan  (COZAAR ) 50 MG tablet Take 50 mg by mouth daily.  (Patient not taking: Reported on 08/04/2023)   [DISCONTINUED] meloxicam  (MOBIC ) 15 MG tablet Take 1 tablet (15 mg total) by mouth daily. (Patient not taking: Reported on 08/04/2023)   [DISCONTINUED] methocarbamol  (ROBAXIN ) 500 MG tablet Take 500 mg by mouth as needed. (Patient not taking: Reported on 08/04/2023)   [DISCONTINUED] omeprazole  (PRILOSEC) 40 MG capsule Take 40 mg by mouth daily.   No facility-administered medications prior to visit.   No Known Allergies  Immunization History  Administered Date(s) Administered   PFIZER(Purple Top)SARS-COV-2 Vaccination 03/16/2019, 04/04/2019   PNEUMOCOCCAL  CONJUGATE-20 08/04/2023   Zoster Recombinant(Shingrix) 10/14/2017    Health Maintenance  Topic Date Due   Medicare Annual Wellness (AWV)  Never done   Hepatitis C Screening  Never done   DTaP/Tdap/Td (1 - Tdap) Never done   Zoster Vaccines- Shingrix (2 of 2) 12/09/2017   DEXA SCAN  Never done   INFLUENZA VACCINE  08/06/2023   COVID-19 Vaccine (3 - Pfizer risk series) 10/06/2023 (Originally 05/02/2019)   MAMMOGRAM  07/18/2025   Colonoscopy  11/07/2030   Pneumococcal Vaccine: 50+ Years  Completed   Hepatitis B Vaccines  Aged Out   HPV VACCINES  Aged Out   Meningococcal B Vaccine  Aged Out    Patient Care Team: Tedric Leeth, Lauraine SAILOR, DO as PCP - General (Family Medicine) Dellie Louanne MATSU, MD (General Surgery) Corlis Honor BROCKS, MD (Internal Medicine)       Objective    BP 132/64 (BP Location: Right Arm, Cuff Size: Normal)   Pulse 96   Ht 5' 2.5 (1.588 m)   Wt 150 lb 4.8 oz (68.2 kg)   SpO2 96%   BMI 27.05 kg/m     Physical Exam Vitals and nursing note reviewed.  Constitutional:      General: She is not in acute distress.    Appearance: Normal appearance.  HENT:     Head: Normocephalic and atraumatic.  Eyes:     General: No scleral icterus.    Extraocular Movements: Extraocular movements intact.     Conjunctiva/sclera: Conjunctivae normal.  Cardiovascular:     Rate  and Rhythm: Normal rate and regular rhythm.     Pulses: Normal pulses.     Heart sounds: Normal heart sounds.  Pulmonary:     Effort: Pulmonary effort is normal. No respiratory distress.     Breath sounds: Normal breath sounds.  Musculoskeletal:     Right lower leg: No edema.     Left lower leg: No edema.  Skin:    General: Skin is warm and dry.  Neurological:     Mental Status: She is alert and oriented to person, place, and time. Mental status is at baseline.  Psychiatric:        Mood and Affect: Mood normal.        Behavior: Behavior normal.     Depression Screen    08/04/2023   11:42 AM  PHQ 2/9 Scores  PHQ - 2 Score 0   No results found for any visits on 08/04/23.  Assessment & Plan     Essential hypertension -     Metoprolol  Succinate ER; Take 0.5 tablets (12.5 mg total) by mouth daily.  Dispense: 45 tablet; Refill: 3 -     Losartan  Potassium; Take 0.5 tablets (25 mg total) by mouth daily.  Dispense: 45 tablet; Refill: 3 -     hydroCHLOROthiazide ; Take 1 tablet (25 mg total) by mouth daily.  Dispense: 90 tablet; Refill: 3 -     amLODIPine  Besylate; Take 1 tablet (2.5 mg total) by mouth daily.  Dispense: 90 tablet; Refill: 3 -     Comprehensive metabolic panel with GFR  Establishing care with new doctor, encounter for  Mixed hyperlipidemia -     Lipid panel  Chronic cough  Dyspnea on exertion -     Pulmonary Function Test; Future  Chronic neck pain with history of cervical spinal surgery -     Meloxicam ; Take 1 tablet (15 mg total) by mouth daily as needed for pain.  Dispense: 90 tablet; Refill:  2  Gastroesophageal reflux disease, unspecified whether esophagitis present -     Vitamin B12 -     Omeprazole ; Take 1 capsule (20 mg total) by mouth daily.  Dispense: 90 capsule; Refill: 3  Thyroid  nodule greater than or equal to 1.5 cm in diameter incidentally noted on imaging study -     US  THYROID ; Future  History of breast cancer  High risk medication use -      Vitamin B12  Encounter for hepatitis C screening test for low risk patient -     HCV Ab w Reflex to Quant PCR  Encounter for osteoporosis screening in asymptomatic postmenopausal patient -     DG Bone Density; Future  Immunization due -     Pneumococcal conjugate vaccine 20-valent      Chronic dry cough and shortness of breath on exertion Chronic dry cough for six months with occasional exertional dyspnea. Possible causes include medication side effects, allergies, environmental exposure, or prior chest radiation for breast cancer. Differential includes asthma, interstitial lung disease, GERD, and medication-induced cough.  - Consider switching allergy medication to Xyzal or Allegra if Zyrtec was effective. - Discussed possible trial Advair for a few days to assess impact on cough, as well as Proventil inhaler as needed to assess impact on cough.  Deferred for the time being - Order pulmonary function test - Order chest x-ray if symptoms persist or worsen. - Reassess in six months.  Allergic rhinitis Allergic rhinitis with fluid in ears and occasional dry cough, possibly exacerbated by environmental factors.  Previous ENT evaluation indicated allergy-related fluid in ears. - Start/use Flonase daily; refill when needed.. - Consider switching allergy medication to Xyzal or Allegra if Zyrtec was effective. - Monitor symptoms and adjust allergy management as needed.  Hypertension Chronic, stable.  Hypertension managed with metoprolol  succinate, losartan , hydrochlorothiazide , and amlodipine . - Continue current antihypertensive regimen. - Ensure metoprolol  succinate prescription is filled and available. - Monitor blood pressure regularly at home.  Gastroesophageal reflux disease (GERD) GERD managed with omeprazole  40 mg as needed, potential contributor to chronic cough. - Discussed reduced dose of omeprazole  for daily use, rather than as needed, to assess impact on cough.  Send  prescription for omeprazole  20 mg. - Adjust omeprazole  dosage as needed based on symptoms.  Chronic neck pain with history of cervical spine surgery Chronic neck pain post-cervical spine surgery with difficulty holding head up. Previous cortisone injections provided relief. Engaging in physical therapy independently. - Consider contacting previous provider for cortisone injections if pain persists. - Continue physical therapy exercises to strengthen neck muscles. - Monitor pain levels and adjust management as needed.  Thyroid  nodule - nodule with undetermined significance - recheck thyroid  ultrasound     Return in about 6 months (around 02/04/2024) for Chronic f/u; and for mAWV with AWV nurse.     I discussed the assessment and treatment plan with the patient  The patient was provided an opportunity to ask questions and all were answered. The patient agreed with the plan and demonstrated an understanding of the instructions.   The patient was advised to call back or seek an in-person evaluation if the symptoms worsen or if the condition fails to improve as anticipated.    LAURAINE LOISE BUOY, DO  [facility] [phone] (phone) [phone] (fax)  [facility]

## 2023-08-04 NOTE — Patient Instructions (Addendum)
 Please contact [phone] to schedule your bone density scan.  Upon results being received our office will contact you. As well as all results can be viewed through your MyChart. Please feel free to contact us  if you have any further questions or concerns.     To address the cough: Can try switching up to Xyzal (levocetirizine) for improved allergy control, or to Allegra (fexofenadine). Recommend taking your Advair for at least several days in a row to see how your cough responds. Try taking your omeprazole  daily for a few days.

## 2023-08-10 ENCOUNTER — Ambulatory Visit
Admission: RE | Admit: 2023-08-10 | Discharge: 2023-08-10 | Disposition: A | Discharge status: Home / Self Care | Source: Ambulatory Visit | Attending: Family Medicine | Admitting: Family Medicine

## 2023-08-10 DIAGNOSIS — E041 Nontoxic single thyroid nodule: Secondary | ICD-10-CM | POA: Diagnosis present

## 2023-08-11 ENCOUNTER — Ambulatory Visit: Payer: Self-pay | Admitting: Family Medicine

## 2023-08-17 ENCOUNTER — Other Ambulatory Visit: Payer: Self-pay

## 2023-08-17 ENCOUNTER — Ambulatory Visit: Payer: Self-pay

## 2023-08-17 ENCOUNTER — Encounter: Payer: Self-pay | Admitting: Family Medicine

## 2023-08-17 MED ORDER — METHOCARBAMOL 500 MG PO TABS
500.0000 mg | ORAL_TABLET | Freq: Four times a day (QID) | ORAL | 0 refills | Status: AC | PRN
  Filled 2023-08-17: qty 20, 5d supply, fill #0

## 2023-08-17 NOTE — Telephone Encounter (Signed)
 FYI Only or Action Required?: FYI only for provider.  Patient was last seen in primary care on 08/04/2023 by Donzella Lauraine SAILOR, DO.  Called Nurse Triage reporting Advice Only.  Symptoms began today.  Interventions attempted: Nothing.  Symptoms are: unchanged.  Triage Disposition: Information or Advice Only Call  Patient/caregiver understands and will follow disposition?: Yes     Copied from CRM [id number]. Topic: Clinical - Medical Advice >> Aug 17, 2023 10:23 AM Rosaria BRAVO wrote: Reason for CRM: Pt fell, went to urgent care on Sunday. Has two fractured ribs on 9 and 10. Her call disconnected before she could finish her request. Wants to know if her PCP needs to see her.   Was only given muscle relaxers but that is okay because she does not want anything high powered she says. Was given Methocarbamol .    Best contact: [phone]   ----------------------------------------------------------------------- From previous Reason for Contact - Red Word Triage: Red Word that prompted transfer to Nurse Triage: Pt fell, went to urgent care on Sunday. Has two fractured ribs on 9 and 10. Reason for Disposition  Health information question, no triage required and triager able to answer question  Answer Assessment - Initial Assessment Questions 1. REASON FOR CALL: What is the main reason for your call? or How can I best help you?     Wants to know if she needs to be seen by PCP after fx rib dx.  Protocols used: Information Only Call - No Triage-A-AH

## 2023-08-17 NOTE — Telephone Encounter (Signed)
 Spoke with patient, verbalizes understanding and states pain in manageable now.  will follow up if needed.

## 2023-09-10 ENCOUNTER — Ambulatory Visit (INDEPENDENT_AMBULATORY_CARE_PROVIDER_SITE_OTHER): Admitting: Family Medicine

## 2023-09-10 ENCOUNTER — Encounter: Payer: Self-pay | Admitting: Family Medicine

## 2023-09-10 VITALS — BP 127/66 | HR 102 | Ht 62.4 in | Wt 151.0 lb

## 2023-09-10 DIAGNOSIS — E042 Nontoxic multinodular goiter: Secondary | ICD-10-CM | POA: Diagnosis not present

## 2023-09-10 DIAGNOSIS — S2241XD Multiple fractures of ribs, right side, subsequent encounter for fracture with routine healing: Secondary | ICD-10-CM | POA: Diagnosis not present

## 2023-09-10 DIAGNOSIS — K219 Gastro-esophageal reflux disease without esophagitis: Secondary | ICD-10-CM | POA: Diagnosis not present

## 2023-09-10 DIAGNOSIS — E782 Mixed hyperlipidemia: Secondary | ICD-10-CM

## 2023-09-10 NOTE — Progress Notes (Signed)
 Established patient visit   Patient: Autumn Salazar   DOB: [birth year]   68 y.o. Female  MRN: [id number] Visit Date: 09/10/2023  Today's healthcare provider: LAURAINE LOISE BUOY, DO   Chief Complaint  Patient presents with   Hospitalization Follow-up    She had a fall the second week of Aug, she was seen and treated at [facility], after Xray she was told 9 and 10 was fractured, she is feeling better since.    Subjective    HPI Autumn Salazar is a 68 year old female who presents with rib pain following a fall.  She has been experiencing notable rib pain following a fall that occurred approximately four weeks ago. The incident involved tripping over a dog bed and falling against a chest of drawers, then to the floor. Initially, an x-ray at an urgent care facility was read as having no fractures, but a subsequent review by a radiologist confirmed rib fracture in the posterior aspects of ribs 9 and 10 on the right. The pain is particularly noticeable when taking deep breaths and wearing a bra. She manages the pain with medication as needed and by avoiding activities that exacerbate the pain, such as twisting or lifting heavy objects.  She also actively practices taking deep breaths to prevent development of atelectasis and pneumonia.  She has a history of thyroid  nodules, which have been previously biopsied. A recent ultrasound showed that the most concerning nodules (dose which were biopsied previously) have decreased in size compared to previous imaging.  There were other nodules noted but the characteristics/appearance on ultrasound led them to be deemed nonconcerning.  She has a history of neck surgery and continues to experience neck problems, being cautious about physical activities to avoid exacerbating her neck issues.  She is currently retired and considering part-time work. She is closing an estate, which has been a source of stress, and plans to go on a beach trip soon.  She has a history of  acid reflux and takes omeprazole  as needed. She is interested in exploring natural remedies such as ginger supplements to manage her symptoms.  She has received both doses of the shingles vaccine and is unsure about her last tetanus vaccine. She does take a daily aspirin as she has no history of cardiovascular events. She is considering joining a fitness center to improve her physical health, particularly focusing on low-impact activities due to her recent rib fracture and ongoing neck issues.      Medications: Outpatient Medications Prior to Visit  Medication Sig   amLODipine  (NORVASC ) 2.5 MG tablet Take 1 tablet (2.5 mg total) by mouth daily.   aspirin 81 MG tablet Take 81 mg by mouth daily.   cetirizine (ZYRTEC) 5 MG tablet Take 5 mg by mouth daily.   fluticasone (FLONASE) 50 MCG/ACT nasal spray Place 1 spray into both nostrils daily as needed for allergies or rhinitis.   hydrochlorothiazide  (HYDRODIURIL ) 25 MG tablet Take 1 tablet (25 mg total) by mouth daily.   losartan  (COZAAR ) 50 MG tablet Take 0.5 tablets (25 mg total) by mouth daily.   meloxicam  (MOBIC ) 15 MG tablet Take 1 tablet (15 mg total) by mouth daily as needed for pain.   methocarbamol  (ROBAXIN ) 500 MG tablet Take 1 tablet (500 mg total) by mouth 4 (four) times daily as needed.   metoprolol  succinate (TOPROL  XL) 25 MG 24 hr tablet Take 0.5 tablets (12.5 mg total) by mouth daily.   omeprazole  (PRILOSEC) 20 MG  capsule Take 1 capsule (20 mg total) by mouth daily.   No facility-administered medications prior to visit.        Objective    BP 127/66 (BP Location: Right Arm, Patient Position: Sitting, Cuff Size: Normal)   Pulse (!) 102   Ht 5' 2.4 (1.585 m)   Wt 151 lb (68.5 kg)   SpO2 100%   BMI 27.27 kg/m     Physical Exam Vitals and nursing note reviewed.  Constitutional:      General: She is not in acute distress.    Appearance: Normal appearance.  HENT:     Head: Normocephalic and atraumatic.  Eyes:      General: No scleral icterus.    Conjunctiva/sclera: Conjunctivae normal.  Cardiovascular:     Rate and Rhythm: Normal rate.  Pulmonary:     Effort: Pulmonary effort is normal.  Neurological:     Mental Status: She is alert and oriented to person, place, and time. Mental status is at baseline.  Psychiatric:        Mood and Affect: Mood normal.        Behavior: Behavior normal.      The 10-year ASCVD risk score (Arnett DK, et al., 2019) is: 7.3%   Values used to calculate the score:     Age: 72 years     Clincally relevant sex: Female     Is Non-Hispanic African American: No     Diabetic: No     Tobacco smoker: No     Systolic Blood Pressure: 127 mmHg     Is BP treated: Yes     HDL Cholesterol: 89 mg/dL     Total Cholesterol: 230 mg/dL   No results found for any visits on 09/10/23.  Assessment & Plan    Closed fracture of multiple ribs of right side with routine healing, subsequent encounter  Multiple thyroid  nodules  Gastroesophageal reflux disease, unspecified whether esophagitis present  Mixed hyperlipidemia     Healing posterior rib fractures Rib fractures at right-sided ribs nine and ten, four weeks post-injury with persistent but improved pain. No pneumonia or respiratory complications. - Continue deep breathing exercises. - Avoid twisting and lifting heavy objects. - Delay flu shot until after returning from the beach trip.  Thyroid  nodules, stable Thyroid  nodules smaller on recent ultrasound. Previous biopsied nodules decreased in size. No current need for biopsy or intervention.  Gastroesophageal reflux disease (GERD) GERD managed with omeprazole  as needed. Discussed long-term side effects and patient's interest in ginger supplements as an alternative. - Consider ginger supplements as an alternative to omeprazole  but discussed it may ultimately not address the actual reflux..  Mixed hyperlipidemia with moderate 10-year ASCVD risk 10-year ASCVD risk at  7.3%. Discussed lifestyle modifications and cholesterol medication. She prefers to avoid medication but open to further evaluation. Discussed calcium cardiac scoring CT scan option. - Consider calcium cardiac scoring CT scan. - Encourage diet and exercise modifications.     Return in about 5 months (around 01/31/2024) for Chronic f/u, and in 3 weeks for flu shot.      I discussed the assessment and treatment plan with the patient  The patient was provided an opportunity to ask questions and all were answered. The patient agreed with the plan and demonstrated an understanding of the instructions.   The patient was advised to call back or seek an in-person evaluation if the symptoms worsen or if the condition fails to improve as anticipated.    Serai Tukes N Esgar Barnick,  DO  [facility] [phone] (phone) [phone] (fax)  [facility]

## 2023-09-10 NOTE — Patient Instructions (Addendum)
 Get flu shot toward the end of September. Recommended vaccines to get pharmacy: Tdap (tetanus, diphtheria and pertussis)

## 2023-10-12 ENCOUNTER — Other Ambulatory Visit: Payer: Self-pay

## 2023-10-12 ENCOUNTER — Ambulatory Visit (INDEPENDENT_AMBULATORY_CARE_PROVIDER_SITE_OTHER)

## 2023-10-12 DIAGNOSIS — Z23 Encounter for immunization: Secondary | ICD-10-CM | POA: Diagnosis not present

## 2024-01-01 ENCOUNTER — Other Ambulatory Visit: Payer: Self-pay

## 2024-01-01 ENCOUNTER — Ambulatory Visit
Admission: EM | Admit: 2024-01-01 | Discharge: 2024-01-01 | Disposition: A | Discharge status: Home / Self Care | Attending: Emergency Medicine | Admitting: Emergency Medicine

## 2024-01-01 DIAGNOSIS — J029 Acute pharyngitis, unspecified: Secondary | ICD-10-CM | POA: Diagnosis not present

## 2024-01-01 DIAGNOSIS — J069 Acute upper respiratory infection, unspecified: Secondary | ICD-10-CM

## 2024-01-01 LAB — POC SOFIA SARS ANTIGEN FIA: SARS Coronavirus 2 Ag: NEGATIVE

## 2024-01-01 LAB — POCT RAPID STREP A (OFFICE): Rapid Strep A Screen: NEGATIVE

## 2024-01-01 LAB — POCT INFLUENZA A/B
Influenza A, POC: NEGATIVE
Influenza B, POC: NEGATIVE

## 2024-01-01 MED ORDER — LIDOCAINE VISCOUS HCL 2 % MT SOLN
15.0000 mL | OROMUCOSAL | 0 refills | Status: DC | PRN
  Filled 2024-01-01: qty 100, 3d supply, fill #0

## 2024-01-01 NOTE — ED Triage Notes (Signed)
 Pt c/o sore throat & cough x1 day. Denies fevers. Has tried OTC meds w/o relief.

## 2024-01-01 NOTE — ED Provider Notes (Signed)
 " [name]    CSN: [id number] Arrival date & time: 01/01/24  1109      History   Chief Complaint Chief Complaint  Patient presents with   Sore Throat    HPI Autumn Salazar is a 68 y.o. female.  Patient presents with 1 day of sore throat and cough.  No fever, shortness of breath, vomiting, diarrhea.  OTC cold medication taken without relief.  Her medical history includes hypertension, breast cancer, Hodgkin's lymphoma.  The history is provided by the patient and medical records.    Past Medical History:  Diagnosis Date   Allergy    Arthritis    Breast cancer (HCC) 2007   Cancer [facility]) 2008   right lumpectomy with sentinel node biopsy and radiation therapy   Esophageal reflux    History of breast cancer 2007?   History of Hodgkin's lymphoma    HLD (hyperlipidemia)    HTN (hypertension) 2000   Personal history of radiation therapy 2008   RIGHT lumpectomy    Patient Active Problem List   Diagnosis Date Noted   Thyroid  nodule 06/12/2014   History of breast cancer 03/21/2013   Tachycardia 03/18/2011   Mixed hyperlipidemia 03/18/2011   Chest pain 03/18/2011   HTN (hypertension) 03/18/2011    Past Surgical History:  Procedure Laterality Date   ABDOMINAL HYSTERECTOMY  2000   biospy     of neck   BREAST BIOPSY Left 2007   breast cancer and rad   BREAST LUMPECTOMY Right 2007   right lumpectomy with sentinel node biopsy and radiation therapy   COLONOSCOPY  2012   Dr. Dellie   COLONOSCOPY WITH PROPOFOL  N/A 11/06/2020   Procedure: COLONOSCOPY WITH PROPOFOL ;  Surgeon: Dessa Reyes ORN, MD;  Location: [facility] ENDOSCOPY;  Service: Endoscopy;  Laterality: N/A;   FINE NEEDLE ASPIRATION Bilateral 06-12-14   thyroid  nodules/BENIGN FOLLICULAR NODULE   LYMPH NODE BIOPSY  1979   mammosite balloon placement   2008   OTHER SURGICAL HISTORY     UPPER GI ENDOSCOPY  2012   Dr. Viktoria    OB History     Gravida  0   Para  0   Term  0   Preterm  0   AB  0    Living  0      SAB  0   IAB  0   Ectopic  0   Multiple  0   Live Births           Obstetric Comments  1st Menstrual Cycle:  15 1st Pregnancy:  NA          Home Medications    Prior to Admission medications  Medication Sig Start Date End Date Taking? Authorizing Provider  lidocaine  (XYLOCAINE ) 2 % solution Use as directed 15 mLs in the mouth or throat as needed for mouth pain (Gargle and spit out.). 01/01/24  Yes Corlis Burnard DEL, NP  amLODipine  (NORVASC ) 2.5 MG tablet Take 1 tablet (2.5 mg total) by mouth daily. 08/04/23   Donzella Lauraine SAILOR, DO  aspirin 81 MG tablet Take 81 mg by mouth daily.    [provider]  cetirizine (ZYRTEC) 5 MG tablet Take 5 mg by mouth daily.    [provider]  fluticasone (FLONASE) 50 MCG/ACT nasal spray Place 1 spray into both nostrils daily as needed for allergies or rhinitis.    [provider]  hydrochlorothiazide  (HYDRODIURIL ) 25 MG tablet Take 1 tablet (25 mg total) by mouth  daily. 08/04/23   Donzella Lauraine SAILOR, DO  losartan  (COZAAR ) 50 MG tablet Take 0.5 tablets (25 mg total) by mouth daily. 08/04/23   Donzella Lauraine SAILOR, DO  meloxicam  (MOBIC ) 15 MG tablet Take 1 tablet (15 mg total) by mouth daily as needed for pain. 08/04/23   Donzella Lauraine SAILOR, DO  methocarbamol  (ROBAXIN ) 500 MG tablet Take 1 tablet (500 mg total) by mouth 4 (four) times daily as needed. 08/15/23     metoprolol  succinate (TOPROL  XL) 25 MG 24 hr tablet Take 0.5 tablets (12.5 mg total) by mouth daily. 08/04/23   Donzella Lauraine SAILOR, DO  omeprazole  (PRILOSEC) 20 MG capsule Take 1 capsule (20 mg total) by mouth daily. 08/04/23   Donzella Lauraine SAILOR, DO    Family History Family History  Problem Relation Age of Onset   Diabetes Mother        family history   Heart failure Mother    Heart attack Father 70       MI   Aneurysm Maternal Grandmother    Heart disease Other        strong family history   Breast cancer Neg Hx     Social History Social  History[1]   Allergies   Patient has no known allergies.   Review of Systems Review of Systems  Constitutional:  Negative for chills and fever.  HENT:  Positive for congestion, postnasal drip and sore throat. Negative for ear pain.   Respiratory:  Positive for cough. Negative for shortness of breath.      Physical Exam Triage Vital Signs ED Triage Vitals  Encounter Vitals Group     BP 01/01/24 1336 120/70     Girls Systolic BP Percentile --      Girls Diastolic BP Percentile --      Boys Systolic BP Percentile --      Boys Diastolic BP Percentile --      Pulse Rate 01/01/24 1336 85     Resp 01/01/24 1336 18     Temp 01/01/24 1336 98.2 F (36.8 C)     Temp Source 01/01/24 1336 Oral     SpO2 01/01/24 1336 95 %     Weight --      Height --      Head Circumference --      Peak Flow --      Pain Score 01/01/24 1335 10     Pain Loc --      Pain Education --      Exclude from Growth Chart --    No data found.  Updated Vital Signs BP 120/70 (BP Location: Right Arm)   Pulse 85   Temp 98.2 F (36.8 C) (Oral)   Resp 18   SpO2 95%   Visual Acuity Right Eye Distance:   Left Eye Distance:   Bilateral Distance:    Right Eye Near:   Left Eye Near:    Bilateral Near:     Physical Exam Constitutional:      General: She is not in acute distress. HENT:     Right Ear: Tympanic membrane normal.     Left Ear: Tympanic membrane normal.     Nose: Rhinorrhea present.     Mouth/Throat:     Mouth: Mucous membranes are moist.     Pharynx: Posterior oropharyngeal erythema present.  Cardiovascular:     Rate and Rhythm: Normal rate and regular rhythm.     Heart sounds: Normal heart sounds.  Pulmonary:  Effort: Pulmonary effort is normal. No respiratory distress.     Breath sounds: Normal breath sounds.  Neurological:     Mental Status: She is alert.      UC Treatments / Results  Labs (all labs ordered are listed, but only abnormal results are displayed) Labs  Reviewed  POCT RAPID STREP A (OFFICE) - Normal  POC SOFIA SARS ANTIGEN FIA - Normal  POCT INFLUENZA A/B - Normal    EKG   Radiology No results found.  Procedures Procedures (including critical care time)  Medications Ordered in UC Medications - No data to display  Initial Impression / Assessment and Plan / UC Course  I have reviewed the triage vital signs and the nursing notes.  Pertinent labs & imaging results that were available during my care of the patient were reviewed by me and considered in my medical decision making (see chart for details).    Acute pharyngitis, viral URI.  Negative COVID, flu, strep.  Treating sore throat with viscous lidocaine .  Discussed other symptomatic treatment including Tylenol or ibuprofen as needed.  Instructed her to follow-up with her PCP on Monday.  ED precautions given.  Education provided on pharyngitis and viral respiratory infection.  She agrees to plan of care.  Final Clinical Impressions(s) / UC Diagnoses   Final diagnoses:  Acute pharyngitis, unspecified etiology  Viral URI     Discharge Instructions      Your strep, COVID, flu tests are negative.    Use the viscous lidocaine  as directed.  Take Tylenol or ibuprofen as needed.  Follow-up with your primary care provider on Monday.  Go to the emergency department if you have worsening symptoms.     ED Prescriptions     Medication Sig Dispense Auth. Provider   lidocaine  (XYLOCAINE ) 2 % solution Use as directed 15 mLs in the mouth or throat as needed for mouth pain (Gargle and spit out.). 100 mL Corlis Burnard DEL, NP      PDMP not reviewed this encounter.    [1]  Social History Tobacco Use   Smoking status: Never    Passive exposure: Past   Smokeless tobacco: Never  Vaping Use   Vaping status: Never Used  Substance Use Topics   Alcohol use: Yes    Alcohol/week: 14.0 standard drinks of alcohol    Types: 14 Glasses of wine per week    Comment: wine socially   Drug  use: No     Corlis Burnard DEL, NP 01/01/24 1426  "

## 2024-01-01 NOTE — Discharge Instructions (Addendum)
 Your strep, COVID, flu tests are negative.    Use the viscous lidocaine  as directed.  Take Tylenol or ibuprofen as needed.  Follow-up with your primary care provider on Monday.  Go to the emergency department if you have worsening symptoms.

## 2024-01-07 ENCOUNTER — Other Ambulatory Visit: Payer: Self-pay

## 2024-01-17 ENCOUNTER — Other Ambulatory Visit: Payer: Self-pay

## 2024-01-18 ENCOUNTER — Other Ambulatory Visit: Payer: Self-pay

## 2024-01-27 ENCOUNTER — Other Ambulatory Visit: Payer: Self-pay

## 2024-01-27 ENCOUNTER — Ambulatory Visit: Admitting: Family Medicine

## 2024-01-27 ENCOUNTER — Encounter: Payer: Self-pay | Admitting: Family Medicine

## 2024-01-27 VITALS — BP 122/61 | HR 90 | Temp 98.0°F | Ht 62.4 in | Wt 155.3 lb

## 2024-01-27 DIAGNOSIS — J302 Other seasonal allergic rhinitis: Secondary | ICD-10-CM | POA: Diagnosis not present

## 2024-01-27 DIAGNOSIS — Z9889 Other specified postprocedural states: Secondary | ICD-10-CM | POA: Diagnosis not present

## 2024-01-27 DIAGNOSIS — E782 Mixed hyperlipidemia: Secondary | ICD-10-CM

## 2024-01-27 DIAGNOSIS — M542 Cervicalgia: Secondary | ICD-10-CM | POA: Insufficient documentation

## 2024-01-27 DIAGNOSIS — R000 Tachycardia, unspecified: Secondary | ICD-10-CM | POA: Diagnosis not present

## 2024-01-27 DIAGNOSIS — G8928 Other chronic postprocedural pain: Secondary | ICD-10-CM

## 2024-01-27 DIAGNOSIS — I10 Essential (primary) hypertension: Secondary | ICD-10-CM

## 2024-01-27 DIAGNOSIS — E041 Nontoxic single thyroid nodule: Secondary | ICD-10-CM | POA: Diagnosis not present

## 2024-01-27 MED ORDER — FLUTICASONE PROPIONATE 50 MCG/ACT NA SUSP
1.0000 | Freq: Every day | NASAL | 11 refills | Status: AC | PRN
  Filled 2024-01-27: qty 16, 30d supply, fill #0

## 2024-01-27 NOTE — Assessment & Plan Note (Signed)
 Heart rate at baseline 90 bpm. No symptoms of chest pain, shortness of breath, or palpitations. Metoprolol  effective for blood pressure control and results and some reduction of heart rate.  Patient prefers to maintain current medication regimen due to initial difficulty with attaining control of her blood pressure. - Continue metoprolol  succinate 12.5 mg daily.

## 2024-01-27 NOTE — Progress Notes (Signed)
 "     Established patient visit   Patient: Autumn Salazar   DOB: [birth year]   68 y.o. Female  MRN: [id number] Visit Date: 01/27/2024  Today's healthcare provider: LAURAINE LOISE BUOY, DO   Chief Complaint  Patient presents with   Medical Management of Chronic Issues    Patient is here today for a 6 month follow up, expresses no concerns.   Subjective    HPI Autumn Salazar is a 69 year old female with hypertension who presents for a follow-up visit.  She is currently taking metoprolol , losartan , hydrochlorothiazide , and amlodipine , all at low doses, for blood pressure. She reports that previous treatments for blood pressure led to significant fatigue and weight gain.  She has a history of neck surgery two years ago, during which an alarm indicated her elevated heart rate, but she did not pursue further evaluation. She attributes some of her current symptoms, such as neck pain and balance issues, to past radiation therapy for Hodgkin's disease, which she believes may have affected her heart valve.  She experiences neck pain that radiates to her head, causing her head to feel heavy and affecting her balance. She has received cortisone shots in her shoulders recently, which have significantly alleviated her symptoms. She also has arthritis and finds that her neck issues exacerbate her discomfort.  She uses a muscle relaxer for neck pain as needed, Prilosec for gastrointestinal symptoms, and meloxicam  for pain, which she uses more frequently than she would like. She also uses Flonase  for seasonal allergies, finding it more effective than oral allergy medications.  She acknowledges a need for more exercise but has been limited by her neck pain. She maintains a healthy diet with very little fat and no added salt. Breakfast typically includes cereal with blueberries and bananas or scrambled eggs with toast.  She walks some for exercise but is waiting for it to be warmer so she and her husband can resume  their walks in the [place].  She also is looking at starting water aerobics classes and/for tai chi.   She recalls a recent episode of throat pain for which she used saltwater gargles, resolving the issue in a few days.  She was prescribed lidocaine  to gargle but was concerned about swallowing it on accident, so she did not use it.      Medications: Show/hide medication list[1]  Review of Systems  Respiratory:  Negative for chest tightness and shortness of breath.   Cardiovascular:  Negative for chest pain, palpitations and leg swelling.  Musculoskeletal:  Positive for neck pain (improved since cortisone injections).  Neurological:  Negative for dizziness and weakness.        Objective    BP 122/61 (BP Location: Right Arm, Patient Position: Sitting, Cuff Size: Normal)   Pulse 90   Temp 98 F (36.7 C) (Oral)   Ht 5' 2.4 (1.585 m)   Wt 155 lb 4.8 oz (70.4 kg)   SpO2 100%   BMI 28.04 kg/m     Physical Exam Constitutional:      Appearance: Normal appearance.  HENT:     Head: Normocephalic and atraumatic.  Eyes:     General: No scleral icterus.    Extraocular Movements: Extraocular movements intact.     Conjunctiva/sclera: Conjunctivae normal.  Cardiovascular:     Rate and Rhythm: Normal rate and regular rhythm.     Pulses: Normal pulses.     Heart sounds: Normal heart sounds.  Pulmonary:     Effort:  Pulmonary effort is normal. No respiratory distress.     Breath sounds: Normal breath sounds.  Abdominal:     General: Bowel sounds are normal.     Palpations: Abdomen is soft.  Musculoskeletal:     Right lower leg: No edema.     Left lower leg: No edema.  Skin:    General: Skin is warm and dry.  Neurological:     Mental Status: She is alert and oriented to person, place, and time. Mental status is at baseline.  Psychiatric:        Mood and Affect: Mood normal.        Behavior: Behavior normal.      No results found for any visits on 01/27/24.  Assessment &  Plan    Primary hypertension Assessment & Plan: Chronic, stable on metoprolol  succinate 12.5 mg daily, losartan  25 mg daily, hydrochlorothiazide  25 mg daily and amlodipine  2.5 mg daily.  Discussed potentially adjusting her medications to reduce her heart rate, given her ongoing elevations, but she prefers her current regimen due to the difficulty originally encountered attaining control of her pressure.   Tachycardia Assessment & Plan: Heart rate at baseline 90 bpm. No symptoms of chest pain, shortness of breath, or palpitations. Metoprolol  effective for blood pressure control and results and some reduction of heart rate.  Patient prefers to maintain current medication regimen due to initial difficulty with attaining control of her blood pressure. - Continue metoprolol  succinate 12.5 mg daily.   Mixed hyperlipidemia Assessment & Plan: Chronic, currently managed with diet and exercise as patient prefers to avoid medication for her cholesterol.  The 10-year ASCVD risk score (Arnett DK, et al., 2019) is: 7.6%, which is moderate.  Rechecking her lipid panel today. - Consider coronary artery calcium screening if lipids remain elevated to better qualify risk.    Thyroid  nodule Assessment & Plan: History of radiation for Hodgkin's disease.  Reassuring ultrasound 08/10/2023 with reduced size of previously biopsied nodules.   Seasonal allergic rhinitis, unspecified trigger Assessment & Plan: Symptoms pronounced seasonally. Prefers Flonase  over allergy pills. - Prescribed Flonase  with refills for seasonal use, as noted.  Orders: -     Fluticasone  Propionate; Place 1-2 sprays into both nostrils daily as needed for allergies or rhinitis.  Dispense: 16 g; Refill: 11  Chronic neck pain with history of cervical spinal surgery Assessment & Plan: Cortisone injections in shoulders provided relief. Frequent use of meloxicam  for arthritis pain, exacerbated by neck issues. - Continue meloxicam  15 mg  daily as needed for pain management. - Consider repeat cortisone injections, with a minimum interval of three months between injections, or as recommended by specialist, if effective.  Defer to specialist management.     Previous lab requisition for CMP, lipid panel, HCV screening and vitamin B12 printed and given to patient.     Return in about 11 days (around 02/07/2024) for [facility] as scheduled with Dr. Franchot.      I discussed the assessment and treatment plan with the patient  The patient was provided an opportunity to ask questions and all were answered. The patient agreed with the plan and demonstrated an understanding of the instructions.   The patient was advised to call back or seek an in-person evaluation if the symptoms worsen or if the condition fails to improve as anticipated.    LAURAINE LOISE BUOY, DO  [facility] [phone] (phone) [phone] (fax)  [facility]    [1]  Outpatient Medications Prior  to Visit  Medication Sig   amLODipine  (NORVASC ) 2.5 MG tablet Take 1 tablet (2.5 mg total) by mouth daily.   aspirin 81 MG tablet Take 81 mg by mouth daily.   cetirizine (ZYRTEC) 5 MG tablet Take 5 mg by mouth daily.   hydrochlorothiazide  (HYDRODIURIL ) 25 MG tablet Take 1 tablet (25 mg total) by mouth daily.   losartan  (COZAAR ) 50 MG tablet Take 0.5 tablets (25 mg total) by mouth daily.   meloxicam  (MOBIC ) 15 MG tablet Take 1 tablet (15 mg total) by mouth daily as needed for pain. (Patient taking differently: Take 15 mg by mouth as needed for pain.)   methocarbamol  (ROBAXIN ) 500 MG tablet Take 1 tablet (500 mg total) by mouth 4 (four) times daily as needed.   metoprolol  succinate (TOPROL  XL) 25 MG 24 hr tablet Take 0.5 tablets (12.5 mg total) by mouth daily.   omeprazole  (PRILOSEC) 20 MG capsule Take 1 capsule (20 mg total) by mouth daily. (Patient taking differently: Take 20 mg by mouth as needed.)   [DISCONTINUED] fluticasone   (FLONASE ) 50 MCG/ACT nasal spray Place 1 spray into both nostrils daily as needed for allergies or rhinitis.   [DISCONTINUED] lidocaine  (XYLOCAINE ) 2 % solution Use as directed 15 mLs in the mouth or throat as needed for mouth pain (Gargle and spit out.).   No facility-administered medications prior to visit.   "

## 2024-01-27 NOTE — Assessment & Plan Note (Signed)
 Cortisone injections in shoulders provided relief. Frequent use of meloxicam  for arthritis pain, exacerbated by neck issues. - Continue meloxicam  15 mg daily as needed for pain management. - Consider repeat cortisone injections, with a minimum interval of three months between injections, or as recommended by specialist, if effective.  Defer to specialist management.

## 2024-01-27 NOTE — Assessment & Plan Note (Addendum)
 History of radiation for Hodgkin's disease.  Reassuring ultrasound 08/10/2023 with reduced size of previously biopsied nodules.

## 2024-01-27 NOTE — Assessment & Plan Note (Addendum)
 Chronic, currently managed with diet and exercise as patient prefers to avoid medication for her cholesterol.  The 10-year ASCVD risk score (Arnett DK, et al., 2019) is: 7.6%, which is moderate.  Rechecking her lipid panel today. - Consider coronary artery calcium screening if lipids remain elevated to better qualify risk.

## 2024-01-27 NOTE — Assessment & Plan Note (Signed)
 Symptoms pronounced seasonally. Prefers Flonase  over allergy pills. - Prescribed Flonase  with refills for seasonal use, as noted.

## 2024-01-27 NOTE — Assessment & Plan Note (Signed)
 Chronic, stable on metoprolol  succinate 12.5 mg daily, losartan  25 mg daily, hydrochlorothiazide  25 mg daily and amlodipine  2.5 mg daily.  Discussed potentially adjusting her medications to reduce her heart rate, given her ongoing elevations, but she prefers her current regimen due to the difficulty originally encountered attaining control of her pressure.

## 2024-01-27 NOTE — Patient Instructions (Addendum)
 Recommended vaccine to obtain at pharmacy: Tdap (tetanus, diphtheria and pertussis).  Please call the [facility] [phone]) to schedule a routine screening bone density test99.

## 2024-02-01 ENCOUNTER — Ambulatory Visit

## 2024-02-06 ENCOUNTER — Observation Stay
Admission: EM | Admit: 2024-02-06 | Discharge: 2024-02-07 | Disposition: A | Attending: Internal Medicine | Admitting: Internal Medicine

## 2024-02-06 ENCOUNTER — Other Ambulatory Visit: Payer: Self-pay

## 2024-02-06 ENCOUNTER — Emergency Department

## 2024-02-06 ENCOUNTER — Observation Stay

## 2024-02-06 DIAGNOSIS — K222 Esophageal obstruction: Principal | ICD-10-CM | POA: Insufficient documentation

## 2024-02-06 DIAGNOSIS — E041 Nontoxic single thyroid nodule: Secondary | ICD-10-CM | POA: Diagnosis present

## 2024-02-06 DIAGNOSIS — K862 Cyst of pancreas: Secondary | ICD-10-CM | POA: Insufficient documentation

## 2024-02-06 DIAGNOSIS — Z923 Personal history of irradiation: Secondary | ICD-10-CM | POA: Insufficient documentation

## 2024-02-06 DIAGNOSIS — K209 Esophagitis, unspecified without bleeding: Secondary | ICD-10-CM

## 2024-02-06 DIAGNOSIS — Z79899 Other long term (current) drug therapy: Secondary | ICD-10-CM | POA: Insufficient documentation

## 2024-02-06 DIAGNOSIS — E782 Mixed hyperlipidemia: Secondary | ICD-10-CM | POA: Diagnosis present

## 2024-02-06 DIAGNOSIS — Z6828 Body mass index (BMI) 28.0-28.9, adult: Secondary | ICD-10-CM | POA: Insufficient documentation

## 2024-02-06 DIAGNOSIS — K2100 Gastro-esophageal reflux disease with esophagitis, without bleeding: Secondary | ICD-10-CM | POA: Insufficient documentation

## 2024-02-06 DIAGNOSIS — Z8571 Personal history of Hodgkin lymphoma: Secondary | ICD-10-CM | POA: Insufficient documentation

## 2024-02-06 DIAGNOSIS — R1310 Dysphagia, unspecified: Secondary | ICD-10-CM | POA: Insufficient documentation

## 2024-02-06 DIAGNOSIS — E669 Obesity, unspecified: Secondary | ICD-10-CM | POA: Insufficient documentation

## 2024-02-06 DIAGNOSIS — Z853 Personal history of malignant neoplasm of breast: Secondary | ICD-10-CM | POA: Insufficient documentation

## 2024-02-06 DIAGNOSIS — E785 Hyperlipidemia, unspecified: Secondary | ICD-10-CM | POA: Insufficient documentation

## 2024-02-06 DIAGNOSIS — Z7722 Contact with and (suspected) exposure to environmental tobacco smoke (acute) (chronic): Secondary | ICD-10-CM | POA: Insufficient documentation

## 2024-02-06 DIAGNOSIS — R600 Localized edema: Secondary | ICD-10-CM | POA: Insufficient documentation

## 2024-02-06 DIAGNOSIS — I10 Essential (primary) hypertension: Secondary | ICD-10-CM | POA: Diagnosis present

## 2024-02-06 DIAGNOSIS — R000 Tachycardia, unspecified: Secondary | ICD-10-CM | POA: Insufficient documentation

## 2024-02-06 LAB — CBC
HCT: 40.7 % (ref 36.0–46.0)
Hemoglobin: 13.8 g/dL (ref 12.0–15.0)
MCH: 32.1 pg (ref 26.0–34.0)
MCHC: 33.9 g/dL (ref 30.0–36.0)
MCV: 94.7 fL (ref 80.0–100.0)
Platelets: UNDETERMINED 10*3/uL (ref 150–400)
RBC: 4.3 MIL/uL (ref 3.87–5.11)
RDW: 13.6 % (ref 11.5–15.5)
WBC: 9.3 10*3/uL (ref 4.0–10.5)
nRBC: 0 % (ref 0.0–0.2)

## 2024-02-06 LAB — BASIC METABOLIC PANEL WITH GFR
Anion gap: 14 (ref 5–15)
BUN: 17 mg/dL (ref 8–23)
CO2: 26 mmol/L (ref 22–32)
Calcium: 10.4 mg/dL — ABNORMAL HIGH (ref 8.9–10.3)
Chloride: 102 mmol/L (ref 98–111)
Creatinine, Ser: 0.65 mg/dL (ref 0.44–1.00)
GFR, Estimated: >60 mL/min
Glucose, Bld: 106 mg/dL — ABNORMAL HIGH (ref 70–99)
Potassium: 3.6 mmol/L (ref 3.5–5.1)
Sodium: 142 mmol/L (ref 135–145)

## 2024-02-06 LAB — HEPATIC FUNCTION PANEL
ALT: 22 U/L (ref 0–44)
AST: 25 U/L (ref 15–41)
Albumin: 4.5 g/dL (ref 3.5–5.0)
Alkaline Phosphatase: 86 U/L (ref 38–126)
Bilirubin, Direct: 0.4 mg/dL — ABNORMAL HIGH (ref 0.0–0.2)
Indirect Bilirubin: 0.6 mg/dL (ref 0.3–0.9)
Total Bilirubin: 0.9 mg/dL (ref 0.0–1.2)
Total Protein: 7.8 g/dL (ref 6.5–8.1)

## 2024-02-06 LAB — TROPONIN T, HIGH SENSITIVITY
Troponin T High Sensitivity: 9 ng/L (ref 0–19)
Troponin T High Sensitivity: 9 ng/L (ref 0–19)

## 2024-02-06 LAB — D-DIMER, QUANTITATIVE: D-Dimer, Quant: 0.85 ug{FEU}/mL — ABNORMAL HIGH (ref 0.00–0.50)

## 2024-02-06 LAB — LIPASE, BLOOD: Lipase: 28 U/L (ref 11–51)

## 2024-02-06 MED ORDER — IOHEXOL 300 MG/ML  SOLN
100.0000 mL | Freq: Once | INTRAMUSCULAR | Status: AC | PRN
  Administered 2024-02-06: 100 mL via INTRAVENOUS

## 2024-02-06 MED ORDER — SODIUM CHLORIDE 0.9 % IV BOLUS
1000.0000 mL | Freq: Once | INTRAVENOUS | Status: AC
  Administered 2024-02-06: 1000 mL via INTRAVENOUS

## 2024-02-06 MED ORDER — ONDANSETRON 4 MG PO TBDP
ORAL_TABLET | ORAL | Status: AC
  Filled 2024-02-06: qty 1

## 2024-02-06 MED ORDER — FAMOTIDINE IN NACL 20-0.9 MG/50ML-% IV SOLN
20.0000 mg | Freq: Once | INTRAVENOUS | Status: AC
  Administered 2024-02-06: 20 mg via INTRAVENOUS
  Filled 2024-02-06: qty 50

## 2024-02-06 MED ORDER — ONDANSETRON HCL 4 MG PO TABS: 4.0000 mg | ORAL_TABLET | Freq: Four times a day (QID) | ORAL | Status: DC | PRN

## 2024-02-06 MED ORDER — ONDANSETRON HCL 4 MG/2ML IJ SOLN: 4.0000 mg | Freq: Four times a day (QID) | INTRAMUSCULAR | Status: DC | PRN

## 2024-02-06 MED ORDER — HEPARIN SODIUM (PORCINE) 5000 UNIT/ML IJ SOLN
5000.0000 [IU] | Freq: Three times a day (TID) | INTRAMUSCULAR | Status: DC
  Filled 2024-02-06: qty 1

## 2024-02-06 MED ORDER — PANTOPRAZOLE SODIUM 40 MG IV SOLR
40.0000 mg | Freq: Once | INTRAVENOUS | Status: AC
  Administered 2024-02-06: 40 mg via INTRAVENOUS
  Filled 2024-02-06: qty 10

## 2024-02-06 MED ORDER — LOSARTAN POTASSIUM 50 MG PO TABS
25.0000 mg | ORAL_TABLET | Freq: Every day | ORAL | Status: DC
  Administered 2024-02-06 – 2024-02-07 (×2): 25 mg via ORAL
  Filled 2024-02-06 (×2): qty 1

## 2024-02-06 MED ORDER — ACETAMINOPHEN 325 MG PO TABS: 650.0000 mg | ORAL_TABLET | Freq: Four times a day (QID) | ORAL | Status: DC | PRN

## 2024-02-06 MED ORDER — SENNOSIDES-DOCUSATE SODIUM 8.6-50 MG PO TABS: 1.0000 | ORAL_TABLET | Freq: Every evening | ORAL | Status: DC | PRN

## 2024-02-06 MED ORDER — ACETAMINOPHEN 650 MG RE SUPP: 650.0000 mg | Freq: Four times a day (QID) | RECTAL | Status: DC | PRN

## 2024-02-06 MED ORDER — METOPROLOL SUCCINATE ER 25 MG PO TB24
12.5000 mg | ORAL_TABLET | Freq: Every day | ORAL | Status: DC
  Administered 2024-02-06 – 2024-02-07 (×2): 12.5 mg via ORAL
  Filled 2024-02-06 (×2): qty 1

## 2024-02-06 MED ORDER — METOCLOPRAMIDE HCL 5 MG/ML IJ SOLN
10.0000 mg | Freq: Once | INTRAMUSCULAR | Status: AC
  Administered 2024-02-06: 10 mg via INTRAVENOUS
  Filled 2024-02-06: qty 2

## 2024-02-06 MED ORDER — PANTOPRAZOLE SODIUM 40 MG IV SOLR
40.0000 mg | Freq: Two times a day (BID) | INTRAVENOUS | Status: DC
  Administered 2024-02-06 – 2024-02-07 (×2): 40 mg via INTRAVENOUS
  Filled 2024-02-06 (×2): qty 10

## 2024-02-06 MED ORDER — SODIUM CHLORIDE 0.9% FLUSH
3.0000 mL | Freq: Two times a day (BID) | INTRAVENOUS | Status: DC
  Administered 2024-02-06 – 2024-02-07 (×3): 3 mL via INTRAVENOUS

## 2024-02-06 MED ORDER — ONDANSETRON 4 MG PO TBDP
4.0000 mg | ORAL_TABLET | Freq: Once | ORAL | Status: AC
  Administered 2024-02-06: 4 mg via ORAL

## 2024-02-06 NOTE — ED Provider Notes (Signed)
 "  [facility] Provider Note    Event Date/Time   First MD Initiated Contact with Patient 02/06/24 1153     (approximate)   History   Nausea   HPI  Autumn Salazar is a 69 y.o. female past medical history significant for history of breast cancer with lumpectomy and radiation, GERD, known history of Hodgkin's lymphoma, hypertension, hyperlipidemia, presents to the emergency department with nausea and vomiting and chest pain.  States that she has been having multiple episodes recently of nausea and vomiting and having difficulty keeping down food or liquids.  Did eat steak last night but states that she did not feel like it got stuck.  This morning has been unable to tolerate any liquids or swallow her coffee.  States that immediately comes back up.  Have had epigastric abdominal pain.  Also complaining of some swelling to her right leg.  No shortness of breath.  No pleuritic chest pain.  History of radiation for her Hodgkin's lymphoma and for her breast cancer.  History of splenectomy and hysterectomy.  Not on any active therapy at this time.  No history of endoscopy.     Physical Exam   Triage Vital Signs: ED Triage Vitals  Encounter Vitals Group     BP 02/06/24 1052 (!) 177/88     Girls Systolic BP Percentile --      Girls Diastolic BP Percentile --      Boys Systolic BP Percentile --      Boys Diastolic BP Percentile --      Pulse Rate 02/06/24 1052 (!) 118     Resp 02/06/24 1053 16     Temp 02/06/24 1052 98.3 F (36.8 C)     Temp Source 02/06/24 1052 Oral     SpO2 02/06/24 1052 98 %     Weight 02/06/24 1050 155 lb 3.3 oz (70.4 kg)     Height --      Head Circumference --      Peak Flow --      Pain Score 02/06/24 1050 5     Pain Loc --      Pain Education --      Exclude from Growth Chart --     Most recent vital signs: Vitals:   02/06/24 1300 02/06/24 1555  BP: 135/67 (!) 170/97  Pulse: (!) 101 (!) 110  Resp: 16 18  Temp:  98.3 F (36.8  C)  SpO2: 100% 98%    Physical Exam Constitutional:      Appearance: She is well-developed.     Comments: Holding an emesis bag, spitting secretions in the emesis bag  HENT:     Head: Atraumatic.     Mouth/Throat:     Mouth: Mucous membranes are dry.  Eyes:     Conjunctiva/sclera: Conjunctivae normal.  Cardiovascular:     Rate and Rhythm: Regular rhythm. Tachycardia present.  Pulmonary:     Effort: No respiratory distress.  Abdominal:     General: There is no distension.     Tenderness: There is abdominal tenderness (Epigastric abdominal tenderness palpation).  Musculoskeletal:        General: Normal range of motion.     Cervical back: Normal range of motion.     Right lower leg: No edema.     Left lower leg: No edema.     Comments: Mild swelling to the right lower extremity when compared to the left lower extremity  Skin:    General: Skin  is warm.     Capillary Refill: Capillary refill takes less than 2 seconds.  Neurological:     Mental Status: She is alert. Mental status is at baseline.  Psychiatric:        Mood and Affect: Mood normal.     IMPRESSION / MDM / ASSESSMENT AND PLAN / ED COURSE  I reviewed the triage vital signs and the nursing notes.  Differential diagnosis including bowel obstruction, esophagitis, esophageal stricture, food impaction, acute cholecystitis, DVT, ACS.  Have a low suspicion for pulmonary embolism.  Low risk Wells criteria.   EKG  I, Clotilda Punter, the attending physician, personally viewed and interpreted this ECG.  Sinus tachycardia with a heart rate of 116, narrow complex, normal intervals.  2 PVCs present.  No significant ST elevation or depression.  No findings of acute ischemia or dysrhythmia.  Sinus tachycardia while on cardiac telemetry.  RADIOLOGY I independently reviewed imaging, my interpretation of imaging: Chest x-ray with no signs of pneumonia  CT scan of the abdomen and pelvis with findings concerning for food  impaction or severe esophageal stricture with food in the esophagus with debris.  No other signs of a small bowel obstruction.  Stable pancreatic cyst which was discussed with the patient.  Hepatic steatosis which was also discussed with the patient.  LABS (all labs ordered are listed, but only abnormal results are displayed) Labs interpreted as -    Labs Reviewed  BASIC METABOLIC PANEL WITH GFR - Abnormal; Notable for the following components:      Result Value   Glucose, Bld 106 (*)    Calcium 10.4 (*)    All other components within normal limits  HEPATIC FUNCTION PANEL - Abnormal; Notable for the following components:   Bilirubin, Direct 0.4 (*)    All other components within normal limits  D-DIMER, QUANTITATIVE (NOT AT [facility]) - Abnormal; Notable for the following components:   D-Dimer, Quant 0.85 (*)    All other components within normal limits  LIPASE, BLOOD  CBC  HIV ANTIBODY (ROUTINE TESTING W REFLEX)  MAGNESIUM  PHOSPHORUS  TROPONIN T, HIGH SENSITIVITY  TROPONIN T, HIGH SENSITIVITY     MDM  No significant leukocytosis or anemia.  Creatinine appears at baseline.  No significant electrolyte abnormality.  Initial troponin is negative have low suspicion for ACS.  Platelets were not resulted.  Unable to estimate.  This has been present previously.  Has had a splenectomy.  Also had prior history of Hodgkin's lymphoma.  Concern for food impaction or severe esophageal stricture.  Consulted and discussed the patient's case with gastroenterology Dr. Melany -recommended admission to the hospitalist for IV hydration and IV Protonix  twice daily with likely endoscopy tomorrow.  Given IV fluids and IV Protonix .  Low suspicion for DVT, mildly elevated D-dimer so obtain ultrasound of the right lower quadrant.  Chest pain is consistent with a food impaction have a very low suspicion for pulmonary embolism, will obtain a DVT ultrasound of the right lower extremity.  Consulted and admitted to the  hospitalist.      PROCEDURES:  Critical Care performed: No  Procedures  Patient's presentation is most consistent with acute presentation with potential threat to life or bodily function.   MEDICATIONS ORDERED IN ED: Medications  sodium chloride  flush (NS) 0.9 % injection 3 mL (3 mLs Intravenous Given 02/06/24 1559)  acetaminophen  (TYLENOL ) tablet 650 mg (has no administration in time range)    Or  acetaminophen  (TYLENOL ) suppository 650 mg (has no administration  in time range)  senna-docusate (Senokot-S) tablet 1 tablet (has no administration in time range)  heparin  injection 5,000 Units (has no administration in time range)  ondansetron  (ZOFRAN ) tablet 4 mg (has no administration in time range)    Or  ondansetron  (ZOFRAN ) injection 4 mg (has no administration in time range)  pantoprazole  (PROTONIX ) injection 40 mg (has no administration in time range)  metoprolol  succinate (TOPROL -XL) 24 hr tablet 12.5 mg (12.5 mg Oral Given 02/06/24 1557)  losartan  (COZAAR ) tablet 25 mg (25 mg Oral Given 02/06/24 1557)  ondansetron  (ZOFRAN -ODT) disintegrating tablet 4 mg (4 mg Oral Given 02/06/24 1100)  metoCLOPramide  (REGLAN ) injection 10 mg (10 mg Intravenous Given 02/06/24 1246)  famotidine  (PEPCID ) IVPB 20 mg premix (0 mg Intravenous Stopped 02/06/24 1316)  sodium chloride  0.9 % bolus 1,000 mL (0 mLs Intravenous Stopped 02/06/24 1548)  iohexol  (OMNIPAQUE ) 300 MG/ML solution 100 mL (100 mLs Intravenous Contrast Given 02/06/24 1318)  pantoprazole  (PROTONIX ) injection 40 mg (40 mg Intravenous Given 02/06/24 1448)    FINAL CLINICAL IMPRESSION(S) / ED DIAGNOSES   Final diagnoses:  Obstruction, esophagus     Rx / DC Orders   ED Discharge Orders     None        Note:  This document was prepared using Dragon voice recognition software and may include unintentional dictation errors.   Suzanne Kirsch, MD 02/06/24 1611  "

## 2024-02-06 NOTE — Consult Note (Signed)
 "   Autumn Schaffer, MD   [address] [place], [place] [address] Phone:  [phone] Fax : [phone]  Consultation  Referring Provider:     No ref. provider found Primary Care Physician:  Autumn Lauraine SAILOR, DO Primary Gastroenterologist:  Dr. Dessa, MD         Reason for Consultation:     Dysphagia  Date of Admission:  02/06/2024 Date of Consultation:  02/06/2024         HPI:   Autumn Salazar is a 69 y.o. female with a history of uncontrolled GERD, breats cancer s/p radiation and Hodgkin's Lymphoma, also treated with radiation, who presented to the ER with foreign body sensation since eating steak last night.  She reports a similar episode on her wedding day 37 years ago to a meatball that required urgent EGD to remove. She had no issues until 2-3 years ago, when she noticed dysphagia to meats., She has avoided steak and been careful with other meats since then. She is not atopic and denies h/o Fe deficiency or NSAID/Vit C/Fosamax use.  There is a history of Hodgkins with XRT use 40+ years ago. As a result she has multiple benign-appearing nodules by her report. Also a lower pole breast cancer treated with XRT 20+ years ago.  She takes Nexium once a week if reflux is bad. CT shows debris in the distal esophagus. She is tolerating secretions but was vomiting liquids until 2 hours ago. She is now tolerating.  Colo 2022 with Dr. 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  Past Medical History:  Diagnosis Date   Allergy    Arthritis    Breast cancer (HCC) 2007   Cancer [facility]) 2008   right lumpectomy with sentinel node biopsy and radiation therapy   Esophageal reflux    History of breast cancer 2007?   History of Hodgkin's lymphoma    HLD (hyperlipidemia)    HTN (hypertension) 2000   Personal history of radiation therapy 2008   RIGHT lumpectomy    Past Surgical History:  Procedure Laterality Date   ABDOMINAL HYSTERECTOMY  2000   biospy      of neck   BREAST BIOPSY Left 2007   breast cancer and rad   BREAST LUMPECTOMY Right 2007   right lumpectomy with sentinel node biopsy and radiation therapy   COLONOSCOPY  2012   Dr. Dellie   COLONOSCOPY WITH PROPOFOL  N/A 11/06/2020   Procedure: COLONOSCOPY WITH PROPOFOL ;  Surgeon: Dessa Reyes ORN, MD;  Location: [facility] ENDOSCOPY;  Service: Endoscopy;  Laterality: N/A;   FINE NEEDLE ASPIRATION Bilateral 06-12-14   thyroid  nodules/BENIGN FOLLICULAR NODULE   LYMPH NODE BIOPSY  1979   mammosite balloon placement   2008   OTHER SURGICAL HISTORY     UPPER GI ENDOSCOPY  2012   Dr. Viktoria    Prior to Admission medications  Medication Sig Start Date End Date Taking? Authorizing Provider  amLODipine  (NORVASC ) 2.5 MG tablet Take 1 tablet (2.5 mg total) by mouth daily. 08/04/23  Yes Pardue, Lauraine SAILOR, DO  cetirizine (ZYRTEC) 5 MG tablet Take 5 mg by mouth daily.   Yes [provider]  esomeprazole (NEXIUM) 20 MG capsule Take 20 mg by mouth daily as needed.   Yes [provider]  fluticasone  (FLONASE ) 50 MCG/ACT nasal spray Place 1-2 sprays into both nostrils daily as needed for allergies or rhinitis. 01/27/24  Yes Pardue, Lauraine SAILOR, DO  hydrochlorothiazide  (HYDRODIURIL ) 25 MG tablet  Take 1 tablet (25 mg total) by mouth daily. 08/04/23  Yes Pardue, Lauraine SAILOR, DO  losartan  (COZAAR ) 50 MG tablet Take 0.5 tablets (25 mg total) by mouth daily. 08/04/23  Yes Pardue, Lauraine SAILOR, DO  meloxicam  (MOBIC ) 15 MG tablet Take 1 tablet (15 mg total) by mouth daily as needed for pain. Patient taking differently: Take 15 mg by mouth as needed for pain. 08/04/23  Yes Pardue, Lauraine SAILOR, DO  methocarbamol  (ROBAXIN ) 500 MG tablet Take 1 tablet (500 mg total) by mouth 4 (four) times daily as needed. 08/15/23  Yes   metoprolol  succinate (TOPROL  XL) 25 MG 24 hr tablet Take 0.5 tablets (12.5 mg total) by mouth daily. 08/04/23  Yes Autumn Lauraine SAILOR, DO  aspirin 81 MG tablet Take 81 mg by mouth daily. Patient not taking:  Reported on 02/06/2024    [provider]  omeprazole  (PRILOSEC) 20 MG capsule Take 1 capsule (20 mg total) by mouth daily. Patient taking differently: Take 20 mg by mouth as needed. 08/04/23   Autumn Lauraine SAILOR, DO    Family History  Problem Relation Age of Onset   Diabetes Mother        family history   Heart failure Mother    Heart attack Father 11       MI   Aneurysm Maternal Grandmother    Heart disease Other        strong family history   Breast cancer Neg Hx      Social History[1]  Allergies as of 02/06/2024   (No Known Allergies)    Review of Systems:    All systems reviewed and negative except where noted in HPI.   Physical Exam:  Vital signs in last 24 hours: Temp:  [98.3 F (36.8 C)] 98.3 F (36.8 C) (02/01 1555) Pulse Rate:  [101-118] 110 (02/01 1555) Resp:  [16-18] 18 (02/01 1555) BP: (135-177)/(67-97) 170/97 (02/01 1555) SpO2:  [98 %-100 %] 98 % (02/01 1555) Weight:  [70.4 kg] 70.4 kg (02/01 1050)   General:   Pleasant, cooperative in NAD Head:  Normocephalic and atraumatic. Eyes:   No icterus.   Conjunctiva pink. PERRLA. Ears:  Normal auditory acuity. Neck:  Supple; no masses or thyroidomegaly Lungs: Respirations even and unlabored. Lungs clear to auscultation bilaterally.   No wheezes, crackles, or rhonchi.  Heart:  Regular rate and rhythm;  Without murmur, clicks, rubs or gallops Abdomen:  Soft, nondistended, nontender. Normal bowel sounds. No appreciable masses or hepatomegaly.  No rebound or guarding.  Rectal:  Not performed. Msk:  Symmetrical without gross deformities.  Extremities:  Without edema, cyanosis or clubbing. Neurologic:  Alert and oriented x3;  grossly normal neurologically. Skin:  Intact without significant lesions or rashes. Cervical Nodes:  No significant cervical adenopathy. Psych:  Alert and cooperative. Normal affect.  LAB RESULTS: Recent Labs    02/06/24 1053  WBC 9.3  HGB 13.8  HCT 40.7  PLT PLATELET CLUMPS NOTED  ON SMEAR, UNABLE TO ESTIMATE   BMET Recent Labs    02/06/24 1053  NA 142  K 3.6  CL 102  CO2 26  GLUCOSE 106*  BUN 17  CREATININE 0.65  CALCIUM 10.4*   LFT Recent Labs    02/06/24 1053  PROT 7.8  ALBUMIN 4.5  AST 25  ALT 22  ALKPHOS 86  BILITOT 0.9  BILIDIR 0.4*  IBILI 0.6   PT/INR No results for input(s): LABPROT, INR in the last 72 hours.  STUDIES: US  Venous Img Lower  Unilateral Right Result Date: 02/06/2024 CLINICAL DATA:  Right leg edema since yesterday EXAM: RIGHT LOWER EXTREMITY VENOUS DOPPLER ULTRASOUND TECHNIQUE: Gray-scale sonography with compression, as well as color and duplex ultrasound, were performed to evaluate the deep venous system(s) from the level of the common femoral vein through the popliteal and proximal calf veins. COMPARISON:  None Available. FINDINGS: VENOUS Normal compressibility of the common femoral, superficial femoral, and popliteal veins, as well as the visualized calf veins. Visualized portions of profunda femoral vein and great saphenous vein unremarkable. No filling defects to suggest DVT on grayscale or color Doppler imaging. Doppler waveforms show normal direction of venous flow, normal respiratory plasticity and response to augmentation. Limited views of the contralateral common femoral vein are unremarkable. OTHER None. Limitations: none IMPRESSION: 1. No evidence of deep venous thrombosis within the right lower extremity. Electronically Signed   By: Ozell Daring M.D.   On: 02/06/2024 14:52   CT ABDOMEN PELVIS W CONTRAST Result Date: 02/06/2024 EXAM: CT ABDOMEN AND PELVIS WITH CONTRAST 02/06/2024 01:18:41 PM TECHNIQUE: CT of the abdomen and pelvis was performed with the administration of 100 mL iohexol  (OMNIPAQUE ) 300 MG/ML solution. Multiplanar reformatted images are provided for review. Automated exposure control, iterative reconstruction, and/or weight-based adjustment of the mA/kV was utilized to reduce the radiation dose to as low as  reasonably achievable. COMPARISON: 12/14/2010 CLINICAL HISTORY: Acute, nonlocalized abdominal pain, upper abdominal pain, nausea, vomiting, chest pain. FINDINGS: LOWER CHEST: Extensive debris within the distal esophagus may reflect a food bolus and/or may relate to an underlying stricture at the gastroesophageal junction. Correlation with endoscopy is recommended for further evaluation. LIVER: Moderate hepatic steatosis. GALLBLADDER AND BILE DUCTS: Gallbladder is unremarkable. No biliary ductal dilatation. SPLEEN: Status post splenectomy. PANCREAS: Stable 7 mm cystic lesion within the head of the pancreas (29/2) likely considered benign given stability over time. ADRENAL GLANDS: No acute abnormality. KIDNEYS, URETERS AND BLADDER: No stones in the kidneys or ureters. No hydronephrosis. No perinephric or periureteral stranding. Urinary bladder is unremarkable. GI AND BOWEL: Stomach demonstrates no acute abnormality. There is no bowel obstruction. PERITONEUM AND RETROPERITONEUM: No ascites. No free air. VASCULATURE: Aorta is normal in caliber. Mild aortoiliac atherosclerotic calcification. LYMPH NODES: No lymphadenopathy. REPRODUCTIVE ORGANS: Status post hysterectomy. No adnexal mass. BONES AND SOFT TISSUES: No acute osseous abnormality. No focal soft tissue abnormality. IMPRESSION: 1. Extensive debris within the distal esophagus may reflect a food bolus and/or relate to an underlying stricture at the gastroesophageal junction, and endoscopy is recommended for further evaluation. 2. Moderate hepatic steatosis. 3. Stable 7 mm cystic lesion within the pancreatic head, likely benign given long-term stability. 4. Status post splenectomy. 5. Status post hysterectomy. 6. RAF score includes aortic atherosclerosis (ICD10-I70.0). Electronically signed by: Dorethia Molt MD 02/06/2024 01:31 PM EST RP Workstation: [id number]   DG Chest 2 View Result Date: 02/06/2024 CLINICAL DATA:  chest pain EXAM: CHEST - 2 VIEW COMPARISON:   December 09, 2021 FINDINGS: The cardiomediastinal silhouette is unchanged in contour.Atherosclerotic calcifications. No pleural effusion. No pneumothorax. No acute pleuroparenchymal abnormality. Visualized abdomen is unremarkable. Mild degenerative changes of the thoracic spine. IMPRESSION: No acute cardiopulmonary abnormality. Electronically Signed   By: Corean Salter M.D.   On: 02/06/2024 11:20      Impression / Plan:   Assessment: Principal Problem:   Esophageal stricture Active Problems:   Mixed hyperlipidemia   HTN (hypertension)   Thyroid  nodule   Autumn Salazar is a 69 y.o. y/o female with dysphagia to solids, currently tolerating secretions and  recently passing an oral challenge.  The history of poorly controlled GERD makes a peptic stricture most likely, but hte remote history of food impaction also raises the possibility of EoE. Radiation treatment is too remote to be the culprit now, but thyroid  enlargement is possible and if the EGD is normal, should be considered.  Plan:  Agree with IV PPI. She has been made NPO after midnight for EGD in the AM.  Thank you for involving me in the care of this patient.      LOS: 0 days   Autumn Autumn HERO, MD, MD. NOLIA 02/06/2024, 4:45 PM,  Pager [phone] 7am-5pm  Check AMION for 5pm -7am coverage and on weekends   Note: This dictation was prepared with Dragon dictation along with smaller phrase technology. Any transcriptional errors that result from this process are unintentional.       [1]  Social History Tobacco Use   Smoking status: Never    Passive exposure: Past   Smokeless tobacco: Never  Vaping Use   Vaping status: Never Used  Substance Use Topics   Alcohol use: Yes    Alcohol/week: 14.0 standard drinks of alcohol    Types: 14 Glasses of wine per week    Comment: wine socially   Drug use: No   "

## 2024-02-07 ENCOUNTER — Encounter: Admission: EM | Disposition: A | Payer: Self-pay | Source: Home / Self Care | Attending: Emergency Medicine

## 2024-02-07 ENCOUNTER — Observation Stay

## 2024-02-07 ENCOUNTER — Encounter

## 2024-02-07 DIAGNOSIS — K2100 Gastro-esophageal reflux disease with esophagitis, without bleeding: Secondary | ICD-10-CM | POA: Diagnosis not present

## 2024-02-07 DIAGNOSIS — K222 Esophageal obstruction: Secondary | ICD-10-CM

## 2024-02-07 DIAGNOSIS — K209 Esophagitis, unspecified without bleeding: Secondary | ICD-10-CM

## 2024-02-07 LAB — BASIC METABOLIC PANEL WITH GFR
Anion gap: 12 (ref 5–15)
BUN: 15 mg/dL (ref 8–23)
CO2: 27 mmol/L (ref 22–32)
Calcium: 9.7 mg/dL (ref 8.9–10.3)
Chloride: 105 mmol/L (ref 98–111)
Creatinine, Ser: 0.63 mg/dL (ref 0.44–1.00)
GFR, Estimated: >60 mL/min
Glucose, Bld: 102 mg/dL — ABNORMAL HIGH (ref 70–99)
Potassium: 3 mmol/L — ABNORMAL LOW (ref 3.5–5.1)
Sodium: 143 mmol/L (ref 135–145)

## 2024-02-07 LAB — CBC
HCT: 36.8 % (ref 36.0–46.0)
Hemoglobin: 12.3 g/dL (ref 12.0–15.0)
MCH: 32.3 pg (ref 26.0–34.0)
MCHC: 33.4 g/dL (ref 30.0–36.0)
MCV: 96.6 fL (ref 80.0–100.0)
Platelets: UNDETERMINED 10*3/uL (ref 150–400)
RBC: 3.81 MIL/uL — ABNORMAL LOW (ref 3.87–5.11)
RDW: 13.9 % (ref 11.5–15.5)
WBC: 9.2 10*3/uL (ref 4.0–10.5)
nRBC: 0 % (ref 0.0–0.2)

## 2024-02-07 LAB — PHOSPHORUS: Phosphorus: 2.6 mg/dL (ref 2.5–4.6)

## 2024-02-07 LAB — CBG MONITORING, ED
Glucose-Capillary: 98 mg/dL (ref 70–99)
Glucose-Capillary: 86 mg/dL (ref 70–99)

## 2024-02-07 LAB — MAGNESIUM: Magnesium: 1.8 mg/dL (ref 1.7–2.4)

## 2024-02-07 LAB — HIV ANTIBODY (ROUTINE TESTING W REFLEX): HIV Screen 4th Generation wRfx: NONREACTIVE

## 2024-02-07 MED ORDER — ONDANSETRON HCL 4 MG/2ML IJ SOLN
INTRAMUSCULAR | Status: AC
  Filled 2024-02-07: qty 2

## 2024-02-07 MED ORDER — ONDANSETRON HCL 4 MG/2ML IJ SOLN
INTRAMUSCULAR | Status: DC | PRN
  Administered 2024-02-07: 4 mg via INTRAVENOUS

## 2024-02-07 MED ORDER — LIDOCAINE HCL (PF) 2 % IJ SOLN
INTRAMUSCULAR | Status: AC
  Filled 2024-02-07: qty 5

## 2024-02-07 MED ORDER — PROPOFOL 10 MG/ML IV BOLUS
INTRAVENOUS | Status: DC | PRN
  Administered 2024-02-07: 160 mg via INTRAVENOUS

## 2024-02-07 MED ORDER — PANTOPRAZOLE SODIUM 40 MG PO TBEC: 40.0000 mg | DELAYED_RELEASE_TABLET | Freq: Two times a day (BID) | ORAL | 0 refills | Status: AC

## 2024-02-07 MED ORDER — SUCCINYLCHOLINE CHLORIDE 200 MG/10ML IV SOSY
PREFILLED_SYRINGE | INTRAVENOUS | Status: DC | PRN
  Administered 2024-02-07: 120 mg via INTRAVENOUS

## 2024-02-07 MED ORDER — LIDOCAINE HCL (CARDIAC) PF 100 MG/5ML IV SOSY
PREFILLED_SYRINGE | INTRAVENOUS | Status: DC | PRN
  Administered 2024-02-07: 60 mg via INTRAVENOUS

## 2024-02-07 MED ORDER — SODIUM CHLORIDE 0.9 % IV SOLN: INTRAVENOUS | Status: DC

## 2024-02-07 MED ORDER — DEXAMETHASONE SOD PHOSPHATE PF 10 MG/ML IJ SOLN
INTRAMUSCULAR | Status: AC
  Filled 2024-02-07: qty 1

## 2024-02-07 MED ORDER — SUCCINYLCHOLINE CHLORIDE 200 MG/10ML IV SOSY
PREFILLED_SYRINGE | INTRAVENOUS | Status: AC
  Filled 2024-02-07: qty 10

## 2024-02-07 NOTE — Discharge Instructions (Signed)
 SABRA

## 2024-02-07 NOTE — Discharge Summary (Signed)
 " Physician Discharge Summary   Patient: Autumn Salazar MRN: [id number] DOB: Sep 30, 1955  Admit date:     02/06/2024  Discharge date: 02/07/24  Discharge Physician: Deliliah Room   PCP: Donzella Lauraine SAILOR, DO   Recommendations at discharge:    F/u with your PCP in one week. F/u with GI in 4 weeks for repeat EGD.  Discharge Diagnoses: Principal Problem:   Esophageal stricture Active Problems:   Mixed hyperlipidemia   HTN (hypertension)   Thyroid  nodule   Acute esophagitis   Hospital Course:  69 y.o. year old female with medical history of hypertension, hyperlipidemia presenting to the ED with nausea and vomiting along with difficulty swallowing. She was unable to drink water even.   CT abdomen pelvis showing extensive debris concerning for food bolus versus underlying stricture and GI was consulted   She does have a long history of GERD. She told me that she has been taking as need prilosec/nexium. Spoke to her about the correct way of taking Ppi.  EGD done on 02/07/24 by Dr Jinny showed  LA grade D reflux esophagitis with no bleeding, benign appearing esophageal stenosis which was dilated. Normal stomach and duodenum.  She was started on a mechanical soft diet. Dced home on oral protonix  40 mg BIDAC. Need repeat EGD in 4 weeks and outpatient GI f/u.  Right lower extremity edema: DVT study negative   Disposition: Home. [facility].      Consultants: GI Procedures performed: EGD with esophageal dilation  Disposition: Home Diet recommendation:  Mechanical soft diet DISCHARGE MEDICATION: Allergies as of 02/07/2024   No Known Allergies      Medication List     STOP taking these medications    aspirin 81 MG tablet   meloxicam  15 MG tablet Commonly known as: MOBIC    NexIUM 20 MG capsule Generic drug: esomeprazole   omeprazole  20 MG capsule Commonly known as: PRILOSEC       TAKE these medications    amLODipine  2.5 MG tablet Commonly known as: NORVASC  Take 1 tablet (2.5  mg total) by mouth daily.   cetirizine 5 MG tablet Commonly known as: ZYRTEC Take 5 mg by mouth daily.   fluticasone  50 MCG/ACT nasal spray Commonly known as: FLONASE  Place 1-2 sprays into both nostrils daily as needed for allergies or rhinitis.   hydrochlorothiazide  25 MG tablet Commonly known as: HYDRODIURIL  Take 1 tablet (25 mg total) by mouth daily.   losartan  50 MG tablet Commonly known as: COZAAR  Take 0.5 tablets (25 mg total) by mouth daily.   methocarbamol  500 MG tablet Commonly known as: ROBAXIN  Take 1 tablet (500 mg total) by mouth 4 (four) times daily as needed.   metoprolol  succinate 25 MG 24 hr tablet Commonly known as: Toprol  XL Take 0.5 tablets (12.5 mg total) by mouth daily.   pantoprazole  40 MG tablet Commonly known as: Protonix  Take 1 tablet (40 mg total) by mouth 2 (two) times daily before a meal.        Follow-up Information     Pardue, Lauraine SAILOR, DO. Schedule an appointment as soon as possible for a visit in 1 week(s).   Specialty: Family Medicine Contact information: [address] [place] [address] [phone]         Jinny Carmine, MD. Schedule an appointment as soon as possible for a visit in 1 month(s).   Specialty: Gastroenterology Contact information: [address] [place]  [place] [address] [phone]  Discharge Exam: Filed Weights   02/06/24 1050  Weight: 70.4 kg   Constitutional: NAD, calm, comfortable Eyes: PERRL, lids and conjunctivae normal ENMT: Mucous membranes are moist. Posterior pharynx clear of any exudate or lesions.Normal dentition.  Neck: normal, supple, no masses, no thyromegaly Respiratory: clear to auscultation bilaterally, no wheezing, no crackles. Normal respiratory effort. No accessory muscle use.  Cardiovascular: Regular rate and rhythm, no murmurs / rubs / gallops. No extremity edema. 2+ pedal pulses. No carotid bruits.  Abdomen: no tenderness, no masses palpated. No  hepatosplenomegaly. Bowel sounds positive.  Musculoskeletal: no clubbing / cyanosis. No joint deformity upper and lower extremities. Good ROM, no contractures. Normal muscle tone.  Skin: no rashes, lesions, ulcers. No induration Neurologic: CN 2-12 grossly intact. Sensation intact, DTR normal. Strength 5/5 x all 4 extremities.  Psychiatric: Normal judgment and insight. Alert and oriented x 3. Normal mood.    Condition at discharge: good  The results of significant diagnostics from this hospitalization (including imaging, microbiology, ancillary and laboratory) are listed below for reference.   Imaging Studies: US  Venous Img Lower Unilateral Right Result Date: 02/06/2024 CLINICAL DATA:  Right leg edema since yesterday EXAM: RIGHT LOWER EXTREMITY VENOUS DOPPLER ULTRASOUND TECHNIQUE: Gray-scale sonography with compression, as well as color and duplex ultrasound, were performed to evaluate the deep venous system(s) from the level of the common femoral vein through the popliteal and proximal calf veins. COMPARISON:  None Available. FINDINGS: VENOUS Normal compressibility of the common femoral, superficial femoral, and popliteal veins, as well as the visualized calf veins. Visualized portions of profunda femoral vein and great saphenous vein unremarkable. No filling defects to suggest DVT on grayscale or color Doppler imaging. Doppler waveforms show normal direction of venous flow, normal respiratory plasticity and response to augmentation. Limited views of the contralateral common femoral vein are unremarkable. OTHER None. Limitations: none IMPRESSION: 1. No evidence of deep venous thrombosis within the right lower extremity. Electronically Signed   By: Ozell Daring M.D.   On: 02/06/2024 14:52   CT ABDOMEN PELVIS W CONTRAST Result Date: 02/06/2024 EXAM: CT ABDOMEN AND PELVIS WITH CONTRAST 02/06/2024 01:18:41 PM TECHNIQUE: CT of the abdomen and pelvis was performed with the administration of 100 mL iohexol   (OMNIPAQUE ) 300 MG/ML solution. Multiplanar reformatted images are provided for review. Automated exposure control, iterative reconstruction, and/or weight-based adjustment of the mA/kV was utilized to reduce the radiation dose to as low as reasonably achievable. COMPARISON: 12/14/2010 CLINICAL HISTORY: Acute, nonlocalized abdominal pain, upper abdominal pain, nausea, vomiting, chest pain. FINDINGS: LOWER CHEST: Extensive debris within the distal esophagus may reflect a food bolus and/or may relate to an underlying stricture at the gastroesophageal junction. Correlation with endoscopy is recommended for further evaluation. LIVER: Moderate hepatic steatosis. GALLBLADDER AND BILE DUCTS: Gallbladder is unremarkable. No biliary ductal dilatation. SPLEEN: Status post splenectomy. PANCREAS: Stable 7 mm cystic lesion within the head of the pancreas (29/2) likely considered benign given stability over time. ADRENAL GLANDS: No acute abnormality. KIDNEYS, URETERS AND BLADDER: No stones in the kidneys or ureters. No hydronephrosis. No perinephric or periureteral stranding. Urinary bladder is unremarkable. GI AND BOWEL: Stomach demonstrates no acute abnormality. There is no bowel obstruction. PERITONEUM AND RETROPERITONEUM: No ascites. No free air. VASCULATURE: Aorta is normal in caliber. Mild aortoiliac atherosclerotic calcification. LYMPH NODES: No lymphadenopathy. REPRODUCTIVE ORGANS: Status post hysterectomy. No adnexal mass. BONES AND SOFT TISSUES: No acute osseous abnormality. No focal soft tissue abnormality. IMPRESSION: 1. Extensive debris within the distal esophagus may reflect a food bolus  and/or relate to an underlying stricture at the gastroesophageal junction, and endoscopy is recommended for further evaluation. 2. Moderate hepatic steatosis. 3. Stable 7 mm cystic lesion within the pancreatic head, likely benign given long-term stability. 4. Status post splenectomy. 5. Status post hysterectomy. 6. RAF score  includes aortic atherosclerosis (ICD10-I70.0). Electronically signed by: Dorethia Molt MD 02/06/2024 01:31 PM EST RP Workstation: [id number]   DG Chest 2 View Result Date: 02/06/2024 CLINICAL DATA:  chest pain EXAM: CHEST - 2 VIEW COMPARISON:  December 09, 2021 FINDINGS: The cardiomediastinal silhouette is unchanged in contour.Atherosclerotic calcifications. No pleural effusion. No pneumothorax. No acute pleuroparenchymal abnormality. Visualized abdomen is unremarkable. Mild degenerative changes of the thoracic spine. IMPRESSION: No acute cardiopulmonary abnormality. Electronically Signed   By: Corean Salter M.D.   On: 02/06/2024 11:20    Microbiology: No results found for this or any previous visit.  Labs: CBC: Recent Labs  Lab 02/06/24 1053 02/07/24 0405  WBC 9.3 9.2  HGB 13.8 12.3  HCT 40.7 36.8  MCV 94.7 96.6  PLT PLATELET CLUMPS NOTED ON SMEAR, UNABLE TO ESTIMATE PLATELET CLUMPS NOTED ON SMEAR, UNABLE TO ESTIMATE   Basic Metabolic Panel: Recent Labs  Lab 02/06/24 1053 02/07/24 0405  NA 142 143  K 3.6 3.0*  CL 102 105  CO2 26 27  GLUCOSE 106* 102*  BUN 17 15  CREATININE 0.65 0.63  CALCIUM 10.4* 9.7  MG  --  1.8  PHOS  --  2.6   Liver Function Tests: Recent Labs  Lab 02/06/24 1053  AST 25  ALT 22  ALKPHOS 86  BILITOT 0.9  PROT 7.8  ALBUMIN 4.5   CBG: Recent Labs  Lab 02/07/24 0731 02/07/24 1143  GLUCAP 98 86    Discharge time spent: 40 minutes.  Signed: Deliliah Room, MD [facility] 02/07/2024 "

## 2024-02-07 NOTE — Anesthesia Postprocedure Evaluation (Signed)
"   Anesthesia Post Note  Patient: Autumn Salazar  Procedure(s) Performed: EGD (ESOPHAGOGASTRODUODENOSCOPY) (Left)  Patient location during evaluation: PACU Anesthesia Type: General Level of consciousness: awake and alert Pain management: pain level controlled Vital Signs Assessment: post-procedure vital signs reviewed and stable Respiratory status: spontaneous breathing, nonlabored ventilation, respiratory function stable and patient connected to nasal cannula oxygen Cardiovascular status: blood pressure returned to baseline and stable Postop Assessment: no apparent nausea or vomiting Anesthetic complications: no   There were no known notable events for this encounter.   Last Vitals:  Vitals:   02/07/24 1310 02/07/24 1348  BP: 117/84 126/79  Pulse: 98 89  Resp: 16 18  Temp:    SpO2: 98% 96%    Last Pain:  Vitals:   02/07/24 1348  TempSrc:   PainSc: 0-No pain                 [name]      "

## 2024-02-07 NOTE — Anesthesia Procedure Notes (Signed)
 Procedure Name: Intubation Date/Time: 02/07/2024 1:24 PM  Performed by: Governor Selinda NOVAK, CRNAPre-anesthesia Checklist: Patient identified, Emergency Drugs available, Suction available and Patient being monitored Patient Re-evaluated:Patient Re-evaluated prior to induction Oxygen Delivery Method: Circle system utilized Preoxygenation: Pre-oxygenation with 100% oxygen Induction Type: IV induction Ventilation: Mask ventilation without difficulty Laryngoscope Size: McGrath and 3 Grade View: Grade I Tube type: Oral Tube size: 6.5 mm Number of attempts: 1 Airway Equipment and Method: Stylet and Oral airway Placement Confirmation: ETT inserted through vocal cords under direct vision, positive ETCO2 and breath sounds checked- equal and bilateral Secured at: 22 cm Tube secured with: Tape Dental Injury: Teeth and Oropharynx as per pre-operative assessment

## 2024-02-07 NOTE — Anesthesia Preprocedure Evaluation (Signed)
"                                    Anesthesia Evaluation  Patient identified by MRN, date of birth, ID band Patient awake    Reviewed: Allergy & Precautions, H&P , NPO status , Patient's Chart, lab work & pertinent test results  Airway Mallampati: II  TM Distance: >3 FB Neck ROM: Full    Dental no notable dental hx.    Pulmonary neg pulmonary ROS   Pulmonary exam normal breath sounds clear to auscultation       Cardiovascular hypertension, negative cardio ROS Normal cardiovascular exam Rhythm:Regular Rate:Normal     Neuro/Psych negative neurological ROS  negative psych ROS   GI/Hepatic negative GI ROS, Neg liver ROS,,,  Endo/Other  negative endocrine ROS    Renal/GU negative Renal ROS  negative genitourinary   Musculoskeletal negative musculoskeletal ROS (+)    Abdominal   Peds negative pediatric ROS (+)  Hematology negative hematology ROS (+)   Anesthesia Other Findings   Reproductive/Obstetrics negative OB ROS                              Anesthesia Physical Anesthesia Plan  ASA: 2  Anesthesia Plan: General   Post-op Pain Management:    Induction: Intravenous  PONV Risk Score and Plan:   Airway Management Planned:   Additional Equipment:   Intra-op Plan:   Post-operative Plan: Extubation in OR  Informed Consent: I have reviewed the patients History and Physical, chart, labs and discussed the procedure including the risks, benefits and alternatives for the proposed anesthesia with the patient or authorized representative who has indicated his/her understanding and acceptance.     Dental advisory given  Plan Discussed with: CRNA  Anesthesia Plan Comments:         Anesthesia Quick Evaluation  "

## 2024-02-08 ENCOUNTER — Encounter: Payer: Self-pay | Admitting: Gastroenterology

## 2024-04-10 ENCOUNTER — Encounter
